# Patient Record
Sex: Male | Born: 1975
Health system: Southern US, Community
[De-identification: ages and names within clinical notes are randomized; demographics above are authoritative.]

## PROBLEM LIST (undated history)

## (undated) DIAGNOSIS — E669 Obesity, unspecified: Secondary | ICD-10-CM

## (undated) DIAGNOSIS — E349 Endocrine disorder, unspecified: Secondary | ICD-10-CM

## (undated) DIAGNOSIS — L723 Sebaceous cyst: Secondary | ICD-10-CM

## (undated) DIAGNOSIS — I10 Essential (primary) hypertension: Secondary | ICD-10-CM

## (undated) DIAGNOSIS — D649 Anemia, unspecified: Secondary | ICD-10-CM

## (undated) DIAGNOSIS — N529 Male erectile dysfunction, unspecified: Secondary | ICD-10-CM

## (undated) DIAGNOSIS — E230 Hypopituitarism: Secondary | ICD-10-CM

## (undated) DIAGNOSIS — E119 Type 2 diabetes mellitus without complications: Secondary | ICD-10-CM

## (undated) DIAGNOSIS — F32A Depression, unspecified: Secondary | ICD-10-CM

## (undated) DIAGNOSIS — E291 Testicular hypofunction: Secondary | ICD-10-CM

## (undated) DIAGNOSIS — L0390 Cellulitis, unspecified: Secondary | ICD-10-CM

## (undated) DIAGNOSIS — F329 Major depressive disorder, single episode, unspecified: Secondary | ICD-10-CM

## (undated) HISTORY — DX: Sebaceous cyst: L72.3

## (undated) HISTORY — DX: Obesity, unspecified: E66.9

## (undated) HISTORY — DX: Type 2 diabetes mellitus without complications: E11.9

## (undated) HISTORY — DX: Male erectile dysfunction, unspecified: N52.9

## (undated) HISTORY — DX: Testicular hypofunction: E29.1

## (undated) HISTORY — DX: Endocrine disorder, unspecified: E34.9

## (undated) HISTORY — DX: Hypopituitarism: E23.0

## (undated) HISTORY — DX: Anemia, unspecified: D64.9

## (undated) HISTORY — DX: Cellulitis, unspecified: L03.90

## (undated) HISTORY — DX: Essential (primary) hypertension: I10

## (undated) HISTORY — DX: Depression, unspecified: F32.A

## (undated) HISTORY — DX: Major depressive disorder, single episode, unspecified: F32.9

---

## 1999-05-07 ENCOUNTER — Encounter: Admission: RE | Admit: 1999-05-07 | Discharge: 1999-08-05 | Payer: Self-pay | Admitting: Internal Medicine

## 1999-09-07 ENCOUNTER — Encounter: Admission: RE | Admit: 1999-09-07 | Discharge: 1999-12-06 | Payer: Self-pay | Admitting: Internal Medicine

## 2004-09-25 ENCOUNTER — Ambulatory Visit: Payer: Self-pay | Admitting: Internal Medicine

## 2005-06-24 ENCOUNTER — Emergency Department (HOSPITAL_COMMUNITY): Admission: EM | Admit: 2005-06-24 | Discharge: 2005-06-24 | Payer: Self-pay | Admitting: Emergency Medicine

## 2005-07-14 ENCOUNTER — Ambulatory Visit: Payer: Self-pay | Admitting: Internal Medicine

## 2005-08-18 ENCOUNTER — Ambulatory Visit: Payer: Self-pay | Admitting: Internal Medicine

## 2005-08-25 ENCOUNTER — Ambulatory Visit: Payer: Self-pay

## 2005-08-25 ENCOUNTER — Encounter: Payer: Self-pay | Admitting: Cardiology

## 2007-04-03 ENCOUNTER — Ambulatory Visit: Payer: Self-pay | Admitting: Internal Medicine

## 2007-04-03 LAB — CONVERTED CEMR LAB
ALT: 21 U/L (ref 0–53)
AST: 18 U/L (ref 0–37)
Albumin: 3.6 g/dL (ref 3.5–5.2)
Alkaline Phosphatase: 55 U/L (ref 39–117)
BUN: 11 mg/dL (ref 6–23)
Basophils Absolute: 0 K/uL (ref 0.0–0.1)
Basophils Relative: 0.5 % (ref 0.0–1.0)
Bilirubin, Direct: 0.1 mg/dL (ref 0.0–0.3)
CO2: 31 meq/L (ref 19–32)
Calcium: 8.7 mg/dL (ref 8.4–10.5)
Chloride: 100 meq/L (ref 96–112)
Cholesterol: 189 mg/dL (ref 0–200)
Creatinine, Ser: 0.9 mg/dL (ref 0.4–1.5)
Eosinophils Absolute: 0.4 K/uL (ref 0.0–0.6)
Eosinophils Relative: 4.9 % (ref 0.0–5.0)
GFR calc Af Amer: 127 mL/min
GFR calc non Af Amer: 105 mL/min
Glucose, Bld: 94 mg/dL (ref 70–99)
HCT: 40.7 % (ref 39.0–52.0)
HDL: 32.7 mg/dL — ABNORMAL LOW (ref >39.0)
Hemoglobin: 14.2 g/dL (ref 13.0–17.0)
Ketones, urine, test strip: NEGATIVE
LDL Cholesterol: 140 mg/dL — ABNORMAL HIGH (ref 0–99)
Lymphocytes Relative: 22.8 % (ref 12.0–46.0)
MCHC: 34.8 g/dL (ref 30.0–36.0)
MCV: 83 fL (ref 78.0–100.0)
Monocytes Absolute: 0.5 K/uL (ref 0.2–0.7)
Monocytes Relative: 5.5 % (ref 3.0–11.0)
Neutro Abs: 5.7 K/uL (ref 1.4–7.7)
Neutrophils Relative %: 66.3 % (ref 43.0–77.0)
Nitrite: NEGATIVE
Platelets: 339 K/uL (ref 150–400)
Potassium: 4.2 meq/L (ref 3.5–5.1)
RBC: 4.91 M/uL (ref 4.22–5.81)
RDW: 13.7 % (ref 11.5–14.6)
Sodium: 139 meq/L (ref 135–145)
Specific Gravity, Urine: 1.025
TSH: 4.14 u[IU]/mL (ref 0.35–5.50)
Total Bilirubin: 0.7 mg/dL (ref 0.3–1.2)
Total CHOL/HDL Ratio: 5.8
Total Protein: 7 g/dL (ref 6.0–8.3)
Triglycerides: 82 mg/dL (ref 0–149)
Urobilinogen, UA: 2
VLDL: 16 mg/dL (ref 0–40)
WBC Urine, dipstick: NEGATIVE
WBC: 8.6 10*3/microliter (ref 4.5–10.5)
pH: 5.5

## 2007-04-10 ENCOUNTER — Ambulatory Visit: Payer: Self-pay | Admitting: Internal Medicine

## 2007-04-17 ENCOUNTER — Ambulatory Visit: Payer: Self-pay | Admitting: Internal Medicine

## 2007-04-17 DIAGNOSIS — L723 Sebaceous cyst: Secondary | ICD-10-CM | POA: Insufficient documentation

## 2007-04-17 LAB — CONVERTED CEMR LAB
FSH: 3.2 milliintl units/mL
LH: 3.1 milliintl units/mL
Prolactin: 9 ng/mL
Testosterone: 157.48 ng/dL — ABNORMAL LOW (ref 350.00–890)

## 2007-04-20 ENCOUNTER — Ambulatory Visit: Payer: Self-pay | Admitting: Internal Medicine

## 2007-04-20 DIAGNOSIS — E291 Testicular hypofunction: Secondary | ICD-10-CM | POA: Insufficient documentation

## 2007-05-09 ENCOUNTER — Telehealth (INDEPENDENT_AMBULATORY_CARE_PROVIDER_SITE_OTHER): Payer: Self-pay | Admitting: *Deleted

## 2007-05-31 ENCOUNTER — Ambulatory Visit: Payer: Self-pay | Admitting: Internal Medicine

## 2007-05-31 DIAGNOSIS — L0390 Cellulitis, unspecified: Secondary | ICD-10-CM

## 2007-05-31 DIAGNOSIS — L0291 Cutaneous abscess, unspecified: Secondary | ICD-10-CM | POA: Insufficient documentation

## 2007-06-01 ENCOUNTER — Telehealth: Payer: Self-pay | Admitting: Internal Medicine

## 2007-06-01 LAB — CONVERTED CEMR LAB
Basophils Absolute: 0 10*3/uL (ref 0.0–0.1)
Basophils Relative: 0.1 % (ref 0.0–1.0)
Eosinophils Absolute: 0.4 10*3/uL (ref 0.0–0.6)
Eosinophils Relative: 4.3 % (ref 0.0–5.0)
HCT: 41.4 % (ref 39.0–52.0)
Hemoglobin: 14 g/dL (ref 13.0–17.0)
Lymphocytes Relative: 20.1 % (ref 12.0–46.0)
MCHC: 33.8 g/dL (ref 30.0–36.0)
MCV: 85 fL (ref 78.0–100.0)
Monocytes Absolute: 0.6 10*3/uL (ref 0.2–0.7)
Monocytes Relative: 6.5 % (ref 3.0–11.0)
Neutro Abs: 6.8 10*3/uL (ref 1.4–7.7)
Neutrophils Relative %: 69 % (ref 43.0–77.0)
Platelets: 320 10*3/uL (ref 150–400)
RBC: 4.87 M/uL (ref 4.22–5.81)
RDW: 14.1 % (ref 11.5–14.6)
WBC: 9.7 10*3/uL (ref 4.5–10.5)

## 2007-06-02 ENCOUNTER — Encounter: Payer: Self-pay | Admitting: Internal Medicine

## 2007-06-02 ENCOUNTER — Ambulatory Visit: Payer: Self-pay

## 2007-06-06 ENCOUNTER — Telehealth: Payer: Self-pay | Admitting: Internal Medicine

## 2007-06-27 ENCOUNTER — Ambulatory Visit: Payer: Self-pay | Admitting: Endocrinology

## 2007-09-19 ENCOUNTER — Encounter: Payer: Self-pay | Admitting: Endocrinology

## 2008-08-08 ENCOUNTER — Telehealth: Payer: Self-pay | Admitting: Internal Medicine

## 2008-08-09 ENCOUNTER — Ambulatory Visit: Payer: Self-pay | Admitting: Endocrinology

## 2008-08-09 LAB — CONVERTED CEMR LAB
Iron: 103 ug/dL
Saturation Ratios: 27.2 %
Testosterone: 178.98 ng/dL — ABNORMAL LOW
Transferrin: 270.2 mg/dL

## 2008-08-13 ENCOUNTER — Telehealth (INDEPENDENT_AMBULATORY_CARE_PROVIDER_SITE_OTHER): Payer: Self-pay | Admitting: *Deleted

## 2009-03-03 ENCOUNTER — Ambulatory Visit: Payer: Self-pay | Admitting: Family Medicine

## 2009-03-03 DIAGNOSIS — H60399 Other infective otitis externa, unspecified ear: Secondary | ICD-10-CM | POA: Insufficient documentation

## 2009-03-05 ENCOUNTER — Ambulatory Visit: Payer: Self-pay | Admitting: Family Medicine

## 2009-03-05 DIAGNOSIS — H66009 Acute suppurative otitis media without spontaneous rupture of ear drum, unspecified ear: Secondary | ICD-10-CM | POA: Insufficient documentation

## 2009-08-22 ENCOUNTER — Telehealth: Payer: Self-pay | Admitting: Internal Medicine

## 2009-11-26 ENCOUNTER — Ambulatory Visit: Payer: Self-pay | Admitting: Internal Medicine

## 2009-11-26 DIAGNOSIS — J020 Streptococcal pharyngitis: Secondary | ICD-10-CM | POA: Insufficient documentation

## 2009-11-26 LAB — CONVERTED CEMR LAB
Cholesterol, target level: 200 mg/dL
HDL goal, serum: 40 mg/dL
LDL Goal: 160 mg/dL
Rapid Strep: NEGATIVE

## 2010-07-21 NOTE — Progress Notes (Signed)
Summary: med refill  Phone Note Refill Request Message from:  Patient on August 22, 2009 4:23 PM  Refills Requested: Medication #1:  CLOMIPHENE CITRATE 50 MG TABS 1/4 tab three times weekly Initial call taken by: Lucious Groves,  August 22, 2009 4:23 PM  Follow-up for Phone Call        please refill x 1 f/u ov is due Follow-up by: Minus Breeding MD,  August 22, 2009 4:35 PM  Additional Follow-up for Phone Call Additional follow up Details #1::        refilled. Left message on voicemail to call back to office  Additional Follow-up by: Sydell Axon,  August 25, 2009 8:33 AM    Additional Follow-up for Phone Call Additional follow up Details #2::    patient notified Follow-up by: Sydell Axon,  August 25, 2009 2:48 PM  Prescriptions: CLOMIPHENE CITRATE 50 MG TABS (CLOMIPHENE CITRATE) 1/4 tab three times weekly  #5 x 1   Entered by:   Lucious Groves   Authorized by:   Minus Breeding MD   Signed by:   Lucious Groves on 08/25/2009   Method used:   Electronically to        [facility]. [phone]* (retail)       [phone]       [place], [place]  [address]       Ph: [phone]       Fax: [phone]   RxID:   [id number]

## 2010-07-21 NOTE — Assessment & Plan Note (Signed)
Summary: ? strep//ccm   Vital Signs:  Patient profile:   35 year old male Temp:     99 degrees F oral Pulse rate:   80 / minute Resp:     14 per minute BP sitting:   130 / 90  (left arm) Cuff size:   large CC: sorethroat since yesterday with fever , Lipid Management   CC:  sorethroat since yesterday with fever  and Lipid Management.  History of Present Illness: fever and sore throat with strept negative severe sore throat chills last night hx of tonsilitis no DM weight loss on atkins no hx of mono?   Lipid Management History:      Positive NCEP/ATP III risk factors include HDL cholesterol less than 40.  Negative NCEP/ATP III risk factors include male age less than 18 years old and non-tobacco-user status.    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 1/4 Tab Three Times Weekly 2)  Ofloxacin 0.3 % Soln (Ofloxacin) .Marland Kitchen.. 10 Drops To Affected Ear Daily For 7 Days. 3)  Azithromycin 250 Mg Tabs (Azithromycin) .... 2 By Mouth Today Then 1 By Mouth Daily For 4 Days.  Current Medications (verified): 1)  Clomiphene Citrate 50 Mg Tabs (Clomiphene  Citrate) .... 1/4 Tab Three Times Weekly 2)  Keflex 500 Mg Caps (Cephalexin) .... One By Mouth Qid For 10 Days  Allergies (verified): 1)  ! Amoxicillin (Amoxicillin)  Past History:  Family History: Last updated: 07-16-07 father deceased sudden cardiac no testosterone problems or infertility  Social History: Last updated: 07/16/07 Never Smoked single student  Risk Factors: Smoking Status: never (11/26/2009)  Past medical, surgical, family and social histories (including risk factors) reviewed, and no changes noted (except as noted below).  Past Medical History: Reviewed history from 08/09/2008 and no changes required. 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 father deceased sudden cardiac no testosterone problems or infertility  Social History: Reviewed history from Jul 16, 2007 and no changes required. Never Smoked single student  Review of Systems  The patient denies anorexia, fever, weight loss, weight gain, vision loss, decreased hearing, hoarseness, chest pain, syncope, dyspnea on exertion, peripheral edema, prolonged cough, headaches, hemoptysis, abdominal pain, melena, hematochezia, severe indigestion/heartburn, hematuria, incontinence, genital sores, muscle weakness, suspicious skin lesions, transient blindness, difficulty walking, depression, unusual weight change, abnormal bleeding, enlarged lymph nodes, angioedema, and breast masses.    Physical Exam  General:  alert and underweight appearing.   Head:  normocephalic and atraumatic.   Eyes:  pupils equal and pupils round.   Ears:  R ear normal and L ear normal.  Nose:  no nasal discharge.   Mouth:  pharyngeal erythema, tonsil hypertropied, white plaque(s), pharyngeal  exudate, and posterior lymphoid hypertrophy.   very large tonsils Lungs:  normal respiratory effort and no wheezes.   Heart:  normal rate and regular rhythm.   Cervical Nodes:  shoddy posterior nodes Psych:  Oriented X3 and not anxious appearing.     Impression & Recommendations:  Problem # 1:  STREPTOCOCCAL PHARYNGITIS (ICD-034.0) Assessment New significant ST with increased tonsil size and white discharge The following medications were removed from the medication list:    Azithromycin 250 Mg Tabs (Azithromycin) .Marland Kitchen... 2 by mouth today then 1 by mouth daily for 4 days. His updated medication list for this problem includes:    Keflex 500 Mg Caps (Cephalexin) ..... One by mouth qid for 10 days  Instructed to complete antibiotics and call if not improved in 48 hours.   consider mono titiers??  Complete Medication List: 1)  Clomiphene Citrate 50 Mg Tabs (Clomiphene citrate) .... 1/4 tab three times weekly 2)  Keflex 500 Mg Caps (Cephalexin) .... One by mouth qid for 10 days  Other Orders: Rapid Strep ([address])  Lipid Assessment/Plan:      Based on NCEP/ATP III, the patient's risk factor category is "0-1 risk factors".  The patient's lipid goals are as follows: Total cholesterol goal is 200; LDL cholesterol goal is 160; HDL cholesterol goal is 40; Triglyceride goal is 150.     Patient Instructions: 1)  Take your antibiotic as prescribed until ALL of it is gone, but stop if you develop a rash or swelling and contact our office as soon as possible. 2)  if the tonsils do not respond call back to [facility] a mono test 3)  ( no hx of mono) Prescriptions: KEFLEX 500 MG CAPS (CEPHALEXIN) one by mouth QID for 10 days  #40 x 0   Entered and Authorized by:   Stacie Glaze MD   Signed by:   Stacie Glaze MD on 11/26/2009   Method used:   Electronically to        [facility]. [phone]* (retail)       [phone] [address].       [place], [place]  [address]        Ph: [phone]       Fax: [phone]   RxID:   [id number]   Laboratory Results    Other Tests  Rapid Strep: negative Comments: Rita Ohara  November 26, 2009 5:17 PM   Kit Test Internal QC: Negative   (Normal Range: Negative)

## 2010-08-10 ENCOUNTER — Ambulatory Visit (INDEPENDENT_AMBULATORY_CARE_PROVIDER_SITE_OTHER): Payer: BC Managed Care – PPO | Admitting: Internal Medicine

## 2010-08-10 ENCOUNTER — Encounter: Payer: Self-pay | Admitting: Internal Medicine

## 2010-08-10 DIAGNOSIS — IMO0001 Reserved for inherently not codable concepts without codable children: Secondary | ICD-10-CM

## 2010-08-10 DIAGNOSIS — M7918 Myalgia, other site: Secondary | ICD-10-CM | POA: Insufficient documentation

## 2010-08-10 MED ORDER — DICLOFENAC SODIUM 75 MG PO TBEC: DELAYED_RELEASE_TABLET | ORAL | Status: AC

## 2010-08-10 MED ORDER — HYDROCODONE-ACETAMINOPHEN 5-500 MG PO TABS: 1.0000 | ORAL_TABLET | ORAL | Status: DC | PRN

## 2010-08-10 NOTE — Assessment & Plan Note (Signed)
Appears to be MSK etiology s/p fall. Discussed possible xray for rib fx but would not change tx. Begin nsaid x3 days then prn with food and no other nsaid. Lortab prn sparingly. Followup if no improvement or worsening.

## 2010-08-10 NOTE — Progress Notes (Signed)
  Subjective:    Patient ID: Scott Franklin, male    DOB: [birth year], 35 y.o.   MRN: [id number]  HPI Pt presents to clinic for evaluation of left anterior rib pain. States suffered fall ~5 days ago slipping on a rug at the bottom of the stairs. Struck left ant lower chest wall. No syncope or other injury and denies dyspnea. Pain became more severe a few days later and now occurs with position change, certain movements and deep breathing. Attempted asa for the pain without significant improvement. No other alleviating or exacerbating factors.   Reviewed PMH, medications, and allergies.    Review of Systems     Objective:   Physical Exam  Constitutional: He appears well-developed and well-nourished.  HENT:  Head: Normocephalic and atraumatic.  Right Ear: External ear normal.  Left Ear: External ear normal.  Eyes: Conjunctivae are normal. No scleral icterus.  Pulmonary/Chest: Effort normal and breath sounds normal. No respiratory distress. He has no wheezes. He has no rales.  Musculoskeletal:       Tenderness to palpation left lower anterior ribs without bony abnormality. No overlying echymomsis or skin abn.  Skin: Skin is warm and dry. He is not diaphoretic.          Assessment & Plan:

## 2010-08-11 ENCOUNTER — Telehealth: Payer: Self-pay | Admitting: *Deleted

## 2010-08-11 NOTE — Telephone Encounter (Signed)
Needs a note to be out of work for at least one week, and will bring his papers to be filled out.

## 2010-08-12 DIAGNOSIS — Z0279 Encounter for issue of other medical certificate: Secondary | ICD-10-CM

## 2010-08-13 NOTE — Telephone Encounter (Signed)
Paperwork to be filled out for weeks of 08/10/2010-08/21/2010. Pt is aware

## 2010-08-14 ENCOUNTER — Telehealth: Payer: Self-pay | Admitting: Internal Medicine

## 2010-08-14 NOTE — Telephone Encounter (Signed)
Pt called to ck on status of disability paperwork that he brought in this past week...the patient can be reached at [phone].

## 2010-08-14 NOTE — Telephone Encounter (Signed)
Paper work is ready and up front  

## 2010-08-17 ENCOUNTER — Telehealth: Payer: Self-pay | Admitting: Internal Medicine

## 2010-08-17 NOTE — Telephone Encounter (Signed)
Completed and faxed last week

## 2010-08-17 NOTE — Telephone Encounter (Signed)
Pt is call checking on status of short term disability form  [employer].

## 2010-08-27 ENCOUNTER — Telehealth: Payer: Self-pay | Admitting: Internal Medicine

## 2010-08-27 NOTE — Telephone Encounter (Signed)
i'll extend. What do i need to do? Another form?

## 2010-08-27 NOTE — Telephone Encounter (Signed)
Pt would like tameisha to return his call concerning disability form

## 2010-08-27 NOTE — Telephone Encounter (Signed)
Would like to know if Dr. Rodena Medin would be willing to extend his short term disability from 2 weeks to 3 because he has been out of work this week as well, which makes 3 weeks total. Pt notes that he is healing but still has some pain and discomfort. He discontinued the pain meds and will return to work on Monday. Pt says he will come back in the offc if Dr. Rodena Medin requires another ov to access his recovery.

## 2010-08-27 NOTE — Telephone Encounter (Signed)
Pt aware and process started

## 2010-08-28 ENCOUNTER — Telehealth: Payer: Self-pay | Admitting: Internal Medicine

## 2010-08-28 NOTE — Telephone Encounter (Signed)
Done. Pt calling to see if paperwork has been faxed over from [facility] yet

## 2010-08-28 NOTE — Telephone Encounter (Signed)
Pt called to speak with nurse ref disability ext papers.... Pt can be reached at [phone].

## 2010-11-12 ENCOUNTER — Encounter: Payer: Self-pay | Admitting: Internal Medicine

## 2010-11-12 ENCOUNTER — Ambulatory Visit (INDEPENDENT_AMBULATORY_CARE_PROVIDER_SITE_OTHER): Payer: BC Managed Care – PPO | Admitting: Internal Medicine

## 2010-11-12 DIAGNOSIS — J069 Acute upper respiratory infection, unspecified: Secondary | ICD-10-CM

## 2010-11-12 MED ORDER — LEVOFLOXACIN 500 MG PO TABS: 500.0000 mg | ORAL_TABLET | Freq: Every day | ORAL | Status: AC

## 2010-11-16 ENCOUNTER — Inpatient Hospital Stay (HOSPITAL_COMMUNITY)
Admission: EM | Admit: 2010-11-16 | Discharge: 2010-11-23 | DRG: 584 | Disposition: A | Discharge status: Home Health | Payer: BC Managed Care – PPO | Attending: Internal Medicine | Admitting: Internal Medicine

## 2010-11-16 ENCOUNTER — Emergency Department (HOSPITAL_COMMUNITY): Payer: BC Managed Care – PPO

## 2010-11-16 DIAGNOSIS — I498 Other specified cardiac arrhythmias: Secondary | ICD-10-CM | POA: Diagnosis present

## 2010-11-16 DIAGNOSIS — R5381 Other malaise: Secondary | ICD-10-CM | POA: Diagnosis present

## 2010-11-16 DIAGNOSIS — I959 Hypotension, unspecified: Secondary | ICD-10-CM | POA: Diagnosis present

## 2010-11-16 DIAGNOSIS — J13 Pneumonia due to Streptococcus pneumoniae: Secondary | ICD-10-CM

## 2010-11-16 DIAGNOSIS — Z6841 Body Mass Index (BMI) 40.0 and over, adult: Secondary | ICD-10-CM

## 2010-11-16 DIAGNOSIS — J9600 Acute respiratory failure, unspecified whether with hypoxia or hypercapnia: Secondary | ICD-10-CM

## 2010-11-16 DIAGNOSIS — A419 Sepsis, unspecified organism: Principal | ICD-10-CM | POA: Diagnosis present

## 2010-11-16 DIAGNOSIS — G4733 Obstructive sleep apnea (adult) (pediatric): Secondary | ICD-10-CM | POA: Diagnosis present

## 2010-11-16 DIAGNOSIS — D72829 Elevated white blood cell count, unspecified: Secondary | ICD-10-CM | POA: Diagnosis present

## 2010-11-16 DIAGNOSIS — E662 Morbid (severe) obesity with alveolar hypoventilation: Secondary | ICD-10-CM | POA: Diagnosis present

## 2010-11-16 DIAGNOSIS — J189 Pneumonia, unspecified organism: Secondary | ICD-10-CM | POA: Diagnosis present

## 2010-11-16 DIAGNOSIS — E871 Hypo-osmolality and hyponatremia: Secondary | ICD-10-CM | POA: Diagnosis present

## 2010-11-16 DIAGNOSIS — J9620 Acute and chronic respiratory failure, unspecified whether with hypoxia or hypercapnia: Secondary | ICD-10-CM | POA: Diagnosis present

## 2010-11-16 LAB — URINE MICROSCOPIC-ADD ON

## 2010-11-16 LAB — GLUCOSE, CAPILLARY
Glucose-Capillary: 135 mg/dL — ABNORMAL HIGH (ref 70–99)
Glucose-Capillary: 119 mg/dL — ABNORMAL HIGH (ref 70–99)

## 2010-11-16 LAB — COMPREHENSIVE METABOLIC PANEL
ALT: 21 U/L (ref 0–53)
AST: 27 U/L (ref 0–37)
Albumin: 2.6 g/dL — ABNORMAL LOW (ref 3.5–5.2)
Alkaline Phosphatase: 53 U/L (ref 39–117)
BUN: 16 mg/dL (ref 6–23)
CO2: 29 mEq/L (ref 19–32)
Calcium: 7.9 mg/dL — ABNORMAL LOW (ref 8.4–10.5)
Chloride: 89 mEq/L — ABNORMAL LOW (ref 96–112)
Creatinine, Ser: 1.11 mg/dL (ref 0.4–1.5)
GFR calc Af Amer: >60 mL/min (ref >60)
GFR calc non Af Amer: >60 mL/min (ref >60)
Glucose, Bld: 122 mg/dL — ABNORMAL HIGH (ref 70–99)
Potassium: 4.2 mEq/L (ref 3.5–5.1)
Sodium: 128 mEq/L — ABNORMAL LOW (ref 135–145)
Total Bilirubin: 0.3 mg/dL (ref 0.3–1.2)
Total Protein: 7 g/dL (ref 6.0–8.3)

## 2010-11-16 LAB — LACTIC ACID, PLASMA: Lactic Acid, Venous: 1.7 mmol/L (ref 0.5–2.2)

## 2010-11-16 LAB — URINALYSIS, ROUTINE W REFLEX MICROSCOPIC
Glucose, UA: 100 mg/dL — AB
Ketones, ur: 15 mg/dL — AB
Leukocytes, UA: NEGATIVE
Nitrite: NEGATIVE
Protein, ur: 100 mg/dL — AB
Specific Gravity, Urine: 1.03 (ref 1.005–1.030)
Urobilinogen, UA: 1 mg/dL (ref 0.0–1.0)
pH: 5.5 (ref 5.0–8.0)

## 2010-11-16 LAB — CARDIAC PANEL(CRET KIN+CKTOT+MB+TROPI)
CK, MB: 1.8 ng/mL (ref 0.3–4.0)
Relative Index: 1.2 (ref 0.0–2.5)
Total CK: 149 U/L (ref 7–232)
Troponin I: <0.3 ng/mL (ref <0.30)

## 2010-11-16 LAB — BLOOD GAS, ARTERIAL
Acid-Base Excess: 6.7 mmol/L — ABNORMAL HIGH (ref 0.0–2.0)
Bicarbonate: 32.7 mEq/L — ABNORMAL HIGH (ref 20.0–24.0)
FIO2: 100 %
O2 Saturation: 95.8 %
Patient temperature: 98.6
TCO2: 34.7 mmol/L (ref 0–100)
pCO2 arterial: 65.6 mmHg (ref 35.0–45.0)
pH, Arterial: 7.318 — ABNORMAL LOW (ref 7.350–7.450)
pO2, Arterial: 84.8 mmHg (ref 80.0–100.0)

## 2010-11-16 LAB — DIFFERENTIAL
Basophils Absolute: 0.2 10*3/uL — ABNORMAL HIGH (ref 0.0–0.1)
Basophils Relative: 1 % (ref 0–1)
Eosinophils Absolute: 0 10*3/uL (ref 0.0–0.7)
Eosinophils Relative: 0 % (ref 0–5)
Lymphocytes Relative: 8 % — ABNORMAL LOW (ref 12–46)
Lymphs Abs: 1.4 10*3/uL (ref 0.7–4.0)
Monocytes Absolute: 1 10*3/uL (ref 0.1–1.0)
Monocytes Relative: 6 % (ref 3–12)
Neutro Abs: 14.5 10*3/uL — ABNORMAL HIGH (ref 1.7–7.7)
Neutrophils Relative %: 85 % — ABNORMAL HIGH (ref 43–77)
WBC Morphology: INCREASED

## 2010-11-16 LAB — CBC
HCT: 43.1 % (ref 39.0–52.0)
Hemoglobin: 15.2 g/dL (ref 13.0–17.0)
MCH: 29 pg (ref 26.0–34.0)
MCHC: 35.3 g/dL (ref 30.0–36.0)
MCV: 82.1 fL (ref 78.0–100.0)
Platelets: 238 10*3/uL (ref 150–400)
RBC: 5.25 MIL/uL (ref 4.22–5.81)
RDW: 14.1 % (ref 11.5–15.5)
WBC: 17.1 10*3/uL — ABNORMAL HIGH (ref 4.0–10.5)

## 2010-11-16 LAB — MRSA PCR SCREENING: MRSA by PCR: NEGATIVE

## 2010-11-16 LAB — CK TOTAL AND CKMB (NOT AT ARMC)
CK, MB: 1.4 ng/mL (ref 0.3–4.0)
Relative Index: 0.9 (ref 0.0–2.5)
Total CK: 149 U/L (ref 7–232)

## 2010-11-16 LAB — APTT: aPTT: 41 seconds — ABNORMAL HIGH (ref 24–37)

## 2010-11-16 LAB — POCT I-STAT 3, ART BLOOD GAS (G3+)
Acid-Base Excess: 5 mmol/L — ABNORMAL HIGH (ref 0.0–2.0)
Bicarbonate: 30.7 mEq/L — ABNORMAL HIGH (ref 20.0–24.0)
O2 Saturation: 97 %
TCO2: 32 mmol/L (ref 0–100)
pCO2 arterial: 49.5 mmHg — ABNORMAL HIGH (ref 35.0–45.0)
pH, Arterial: 7.402 (ref 7.350–7.450)
pO2, Arterial: 92 mmHg (ref 80.0–100.0)

## 2010-11-16 LAB — LIPASE, BLOOD
Lipase: 62 U/L — ABNORMAL HIGH (ref 11–59)
Lipase: 23 U/L (ref 11–59)

## 2010-11-16 LAB — PROTIME-INR
INR: 1.28 (ref 0.00–1.49)
Prothrombin Time: 16.2 seconds — ABNORMAL HIGH (ref 11.6–15.2)

## 2010-11-16 LAB — MAGNESIUM: Magnesium: 2 mg/dL (ref 1.5–2.5)

## 2010-11-16 LAB — TROPONIN I: Troponin I: <0.3 ng/mL (ref <0.30)

## 2010-11-16 LAB — STREP PNEUMONIAE URINARY ANTIGEN: Strep Pneumo Urinary Antigen: NEGATIVE

## 2010-11-16 LAB — PRO B NATRIURETIC PEPTIDE: Pro B Natriuretic peptide (BNP): 244.1 pg/mL — ABNORMAL HIGH (ref 0–125)

## 2010-11-16 LAB — OSMOLALITY: Osmolality: 271 mOsm/kg — ABNORMAL LOW (ref 275–300)

## 2010-11-16 LAB — OSMOLALITY, URINE: Osmolality, Ur: 345 mOsm/kg — ABNORMAL LOW (ref 390–1090)

## 2010-11-16 LAB — PROCALCITONIN: Procalcitonin: 0.86 ng/mL

## 2010-11-17 ENCOUNTER — Inpatient Hospital Stay (HOSPITAL_COMMUNITY): Payer: BC Managed Care – PPO

## 2010-11-17 DIAGNOSIS — J13 Pneumonia due to Streptococcus pneumoniae: Secondary | ICD-10-CM

## 2010-11-17 DIAGNOSIS — J9600 Acute respiratory failure, unspecified whether with hypoxia or hypercapnia: Secondary | ICD-10-CM

## 2010-11-17 DIAGNOSIS — I517 Cardiomegaly: Secondary | ICD-10-CM

## 2010-11-17 LAB — BASIC METABOLIC PANEL
BUN: 20 mg/dL (ref 6–23)
CO2: 33 mEq/L — ABNORMAL HIGH (ref 19–32)
Calcium: 7.9 mg/dL — ABNORMAL LOW (ref 8.4–10.5)
Chloride: 91 mEq/L — ABNORMAL LOW (ref 96–112)
Creatinine, Ser: 1.11 mg/dL (ref 0.4–1.5)
GFR calc Af Amer: >60 mL/min (ref >60)
GFR calc non Af Amer: >60 mL/min (ref >60)
Glucose, Bld: 90 mg/dL (ref 70–99)
Potassium: 3.8 mEq/L (ref 3.5–5.1)
Sodium: 134 mEq/L — ABNORMAL LOW (ref 135–145)

## 2010-11-17 LAB — DIFFERENTIAL
Basophils Absolute: 0.1 10*3/uL (ref 0.0–0.1)
Basophils Relative: 1 % (ref 0–1)
Eosinophils Absolute: 0 10*3/uL (ref 0.0–0.7)
Eosinophils Relative: 0 % (ref 0–5)
Lymphocytes Relative: 16 % (ref 12–46)
Lymphs Abs: 1.7 10*3/uL (ref 0.7–4.0)
Monocytes Absolute: 1.1 10*3/uL — ABNORMAL HIGH (ref 0.1–1.0)
Monocytes Relative: 10 % (ref 3–12)
Neutro Abs: 7.8 10*3/uL — ABNORMAL HIGH (ref 1.7–7.7)
Neutrophils Relative %: 73 % (ref 43–77)
WBC Morphology: INCREASED

## 2010-11-17 LAB — BLOOD GAS, ARTERIAL
Acid-Base Excess: 6.5 mmol/L — ABNORMAL HIGH (ref 0.0–2.0)
Bicarbonate: 31.9 mEq/L — ABNORMAL HIGH (ref 20.0–24.0)
Delivery systems: POSITIVE
Drawn by: 20517
Expiratory PAP: 12
FIO2: 0.7 %
Inspiratory PAP: 20
O2 Saturation: 92.9 %
Patient temperature: 98.1
TCO2: 33.7 mmol/L (ref 0–100)
pCO2 arterial: 58.2 mmHg (ref 35.0–45.0)
pH, Arterial: 7.356 (ref 7.350–7.450)
pO2, Arterial: 65.5 mmHg — ABNORMAL LOW (ref 80.0–100.0)

## 2010-11-17 LAB — CARDIAC PANEL(CRET KIN+CKTOT+MB+TROPI)
CK, MB: 1.7 ng/mL (ref 0.3–4.0)
Relative Index: 1.4 (ref 0.0–2.5)
Total CK: 118 U/L (ref 7–232)
Troponin I: <0.3 ng/mL (ref <0.30)

## 2010-11-17 LAB — LEGIONELLA ANTIGEN, URINE: Legionella Antigen, Urine: NEGATIVE

## 2010-11-17 LAB — CBC
HCT: 41.6 % (ref 39.0–52.0)
Hemoglobin: 14.3 g/dL (ref 13.0–17.0)
MCH: 28.5 pg (ref 26.0–34.0)
MCHC: 34.4 g/dL (ref 30.0–36.0)
MCV: 83 fL (ref 78.0–100.0)
Platelets: 204 10*3/uL (ref 150–400)
RBC: 5.01 MIL/uL (ref 4.22–5.81)
RDW: 14.6 % (ref 11.5–15.5)
WBC: 10.7 10*3/uL — ABNORMAL HIGH (ref 4.0–10.5)

## 2010-11-17 LAB — POCT I-STAT 3, ART BLOOD GAS (G3+)
Acid-Base Excess: 7 mmol/L — ABNORMAL HIGH (ref 0.0–2.0)
Bicarbonate: 34.3 mEq/L — ABNORMAL HIGH (ref 20.0–24.0)
O2 Saturation: 92 %
Patient temperature: 98.6
TCO2: 36 mmol/L (ref 0–100)
pCO2 arterial: 59.2 mmHg (ref 35.0–45.0)
pH, Arterial: 7.371 (ref 7.350–7.450)
pO2, Arterial: 66 mmHg — ABNORMAL LOW (ref 80.0–100.0)

## 2010-11-17 LAB — GLUCOSE, CAPILLARY
Glucose-Capillary: 104 mg/dL — ABNORMAL HIGH (ref 70–99)
Glucose-Capillary: 83 mg/dL (ref 70–99)
Glucose-Capillary: 95 mg/dL (ref 70–99)
Glucose-Capillary: 97 mg/dL (ref 70–99)

## 2010-11-17 LAB — URINE CULTURE
Colony Count: NO GROWTH
Culture  Setup Time: 201205281241
Culture: NO GROWTH

## 2010-11-17 NOTE — Consult Note (Signed)
Scott Franklin, Scott Franklin             ACCOUNT NO.:  [id number]  MEDICAL RECORD NO.:  [id number]           PATIENT TYPE:  I  LOCATION:  2109                         FACILITY:  [facility]  PHYSICIAN:  Charlcie Cradle. Delford Field, MD, FCCPDATE OF BIRTH:  [birth year]75  DATE OF CONSULTATION:  11/16/2010 DATE OF DISCHARGE:                                CONSULTATION   CHIEF COMPLAINT:  Respiratory distress, cough, pneumonia.  HISTORY OF PRESENT ILLNESS:  This is a 35 year old morbidly obese male admitted for pneumonia community acquired, acute hypoxic respiratory failure by [facility].  We were asked to see this patient.  The patient has had cough, fever, and dyspnea, was seen by primary care earlier in the week, treated with Levaquin orally, but has not improved. He comes to the hospital with progression in his symptom status.  PAST MEDICAL HISTORY:  Medical history of obesity only, otherwise noncontributory.  MEDICATIONS PRIOR TO ADMISSION:  None.  ALLERGIES:  PENICILLIN.  SOCIAL HISTORY:  He lives at home with his mother, works as a Copy with a desk job.  Does not smoke.  Does not drink alcohol.  FAMILY HISTORY:  Mother is alive and well.  Father died at 59 years of age of arrhythmia.  REVIEW OF SYSTEMS:  GENERAL:  The patient has had fever, chills, or sweats.  HEENT:  No headaches.  No nasal discharge.  No bleeding.  No voice change, vertigo, photophobia, visual or hearing loss.  SKIN:  No rash or lesions.  CARDIOVASCULAR:  No shortness of breath with rest and exertion.  Denies PND.  Denies chest pain.  Does have cough.  Denies wheezing, edema, palpitations, orthopnea, or syncope.  GU:  No frequency, urgency, dysuria, or hematuria.  GI:  No nausea, vomiting, diarrhea, melena, or change in bowel or bladder patterns.  ENDOCRINE: No polyuria, polydipsia, heat or cold intolerance.  No skin or hair change.  Other systems are reviewed and are negative.  PHYSICAL EXAMINATION:   VITAL SIGNS:  Temp is 101.8, blood pressure is 99/72, pulse 120, saturation 92% on 15 liters non-rebreather, and respirations 28. NEUROLOGIC:  This patient is awake and alert, moves all fours, is morbidly obese.  Cranial nerves II-XII grossly intact.  Strength 5/5 all in extremities.  Speech is clear. NECK:  Supple without lymphadenopathy.  No thyromegaly.  No bruits or jugular venous distention. CARDIOVASCULAR:  Normal S1 and S2.  No S3 or S4.  Regular rate and rhythm.  No murmur, rub, or gallop.  Normal PMI.  Pulses are 2+ and equal. GI:  Abdomen is soft, nontender.  Bowel sounds active.  No organomegaly. The patient is obese. GU:  Deferred. MUSCULOSKELETAL:  MAE.  No joint deformities. SKIN:  No rash or lesions.  LABORATORY DATA:  Chest x-ray showed left upper lobe airspace disease and cardiomegaly, mild interstitial edema.  Sodium 128, potassium 42, chloride 89, CO2 of 29, BUN 16, creatinine 1.1, blood sugar 122. Hemoglobin 15, white count 17,000, albumin 2.6.  Liver functions normal. BNP 244.  Procalcitonin 0.86.  IMPRESSION:  Left upper lobe community-acquired pneumonia with acute hypoxic respiratory failure and morbid obesity.  RECOMMENDATIONS:  Place  him to the ICU, agree with vancomycin and imipenem, add Zithromax, see orders,  titrate oxygen, give neb treatments, flutter valve and incentive spirometry.     Charlcie Cradle Delford Field, MD, [facility]     PEW/MEDQ  D:  11/16/2010  T:  11/16/2010  Job:  [id number]  Electronically Signed by Shan Levans MD FCCP on 11/17/2010 08:59:15 PM

## 2010-11-18 LAB — POCT I-STAT 3, ART BLOOD GAS (G3+)
Acid-Base Excess: 6 mmol/L — ABNORMAL HIGH (ref 0.0–2.0)
Bicarbonate: 32.2 mEq/L — ABNORMAL HIGH (ref 20.0–24.0)
O2 Saturation: 90 %
Patient temperature: 98.6
TCO2: 34 mmol/L (ref 0–100)
pCO2 arterial: 51.3 mmHg — ABNORMAL HIGH (ref 35.0–45.0)
pH, Arterial: 7.406 (ref 7.350–7.450)
pO2, Arterial: 61 mmHg — ABNORMAL LOW (ref 80.0–100.0)

## 2010-11-18 LAB — BASIC METABOLIC PANEL
BUN: 19 mg/dL (ref 6–23)
CO2: 33 mEq/L — ABNORMAL HIGH (ref 19–32)
Calcium: 8.1 mg/dL — ABNORMAL LOW (ref 8.4–10.5)
Chloride: 95 mEq/L — ABNORMAL LOW (ref 96–112)
Creatinine, Ser: 0.8 mg/dL (ref 0.4–1.5)
GFR calc Af Amer: >60 mL/min (ref >60)
GFR calc non Af Amer: >60 mL/min (ref >60)
Glucose, Bld: 75 mg/dL (ref 70–99)
Potassium: 3.9 mEq/L (ref 3.5–5.1)
Sodium: 135 mEq/L (ref 135–145)

## 2010-11-18 LAB — GLUCOSE, CAPILLARY: Glucose-Capillary: 79 mg/dL (ref 70–99)

## 2010-11-18 LAB — CBC
HCT: 41.1 % (ref 39.0–52.0)
Hemoglobin: 13.5 g/dL (ref 13.0–17.0)
MCH: 28.2 pg (ref 26.0–34.0)
MCHC: 32.8 g/dL (ref 30.0–36.0)
MCV: 86 fL (ref 78.0–100.0)
Platelets: 209 10*3/uL (ref 150–400)
RBC: 4.78 MIL/uL (ref 4.22–5.81)
RDW: 14.8 % (ref 11.5–15.5)
WBC: 6.4 10*3/uL (ref 4.0–10.5)

## 2010-11-19 ENCOUNTER — Inpatient Hospital Stay (HOSPITAL_COMMUNITY): Payer: BC Managed Care – PPO

## 2010-11-19 LAB — BASIC METABOLIC PANEL
BUN: 13 mg/dL (ref 6–23)
CO2: 32 mEq/L (ref 19–32)
Calcium: 8 mg/dL — ABNORMAL LOW (ref 8.4–10.5)
Chloride: 98 mEq/L (ref 96–112)
Creatinine, Ser: 0.72 mg/dL (ref 0.4–1.5)
GFR calc Af Amer: >60 mL/min (ref >60)
GFR calc non Af Amer: >60 mL/min (ref >60)
Glucose, Bld: 79 mg/dL (ref 70–99)
Potassium: 3.8 mEq/L (ref 3.5–5.1)
Sodium: 139 mEq/L (ref 135–145)

## 2010-11-19 LAB — CBC
HCT: 39.6 % (ref 39.0–52.0)
Hemoglobin: 12.8 g/dL — ABNORMAL LOW (ref 13.0–17.0)
MCH: 28 pg (ref 26.0–34.0)
MCHC: 32.3 g/dL (ref 30.0–36.0)
MCV: 86.7 fL (ref 78.0–100.0)
Platelets: 230 10*3/uL (ref 150–400)
RBC: 4.57 MIL/uL (ref 4.22–5.81)
RDW: 14.8 % (ref 11.5–15.5)
WBC: 5.7 10*3/uL (ref 4.0–10.5)

## 2010-11-19 LAB — GLUCOSE, CAPILLARY
Glucose-Capillary: 75 mg/dL (ref 70–99)
Glucose-Capillary: 82 mg/dL (ref 70–99)
Glucose-Capillary: 114 mg/dL — ABNORMAL HIGH (ref 70–99)

## 2010-11-19 LAB — PROCALCITONIN: Procalcitonin: 0.46 ng/mL

## 2010-11-20 DIAGNOSIS — G4730 Sleep apnea, unspecified: Secondary | ICD-10-CM

## 2010-11-20 DIAGNOSIS — G4710 Hypersomnia, unspecified: Secondary | ICD-10-CM

## 2010-11-20 LAB — CBC
HCT: 37.3 % — ABNORMAL LOW (ref 39.0–52.0)
Hemoglobin: 12.3 g/dL — ABNORMAL LOW (ref 13.0–17.0)
MCH: 28.1 pg (ref 26.0–34.0)
MCHC: 33 g/dL (ref 30.0–36.0)
MCV: 85.4 fL (ref 78.0–100.0)
Platelets: 233 10*3/uL (ref 150–400)
RBC: 4.37 MIL/uL (ref 4.22–5.81)
RDW: 14.4 % (ref 11.5–15.5)
WBC: 6.2 10*3/uL (ref 4.0–10.5)

## 2010-11-20 LAB — BASIC METABOLIC PANEL
BUN: 9 mg/dL (ref 6–23)
CO2: 33 mEq/L — ABNORMAL HIGH (ref 19–32)
Calcium: 7.9 mg/dL — ABNORMAL LOW (ref 8.4–10.5)
Chloride: 97 mEq/L (ref 96–112)
Creatinine, Ser: 0.57 mg/dL (ref 0.4–1.5)
GFR calc Af Amer: >60 mL/min (ref >60)
GFR calc non Af Amer: >60 mL/min (ref >60)
Glucose, Bld: 91 mg/dL (ref 70–99)
Potassium: 3.5 mEq/L (ref 3.5–5.1)
Sodium: 136 mEq/L (ref 135–145)

## 2010-11-22 ENCOUNTER — Encounter: Payer: Self-pay | Admitting: Internal Medicine

## 2010-11-22 DIAGNOSIS — J069 Acute upper respiratory infection, unspecified: Secondary | ICD-10-CM | POA: Insufficient documentation

## 2010-11-22 LAB — CULTURE, BLOOD (ROUTINE X 2)
Culture  Setup Time: 201205281423
Culture  Setup Time: 201205281945
Culture: NO GROWTH
Culture: NO GROWTH

## 2010-11-22 NOTE — Progress Notes (Signed)
  Subjective:    Patient ID: Scott Franklin, male    DOB: Oct 25, 1975, 35 y.o.   MRN: [id number]  HPI Patient presents to clinic for evaluation of cough. Notes 5 day history of nonproductive cough with nasal congestion. Denies fever chills or shortness of breath. Possible strep exposure. No alleviating or exacerbating factors. No other complaints.  Reviewed past medical history, medications and allergies  Review of Systems  Constitutional: Negative for fever and chills.  HENT: Positive for congestion and rhinorrhea.   Respiratory: Positive for cough. Negative for shortness of breath.   Skin: Negative for pallor and rash.       Objective:   Physical Exam    Physical Exam  Vitals reviewed. Constitutional:  appears well-developed and well-nourished. No distress.  HENT:  Head: Normocephalic and atraumatic.  Right Ear: Tympanic membrane, external ear and ear canal normal.  Left Ear: Tympanic membrane, external ear and ear canal normal.  Nose: Nose normal.  Mouth/Throat: Oropharynx is clear and moist. No oropharyngeal exudate.  Eyes: Conjunctivae and EOM are normal. Pupils are equal, round, and reactive to light. Right eye exhibits no discharge. Left eye exhibits no discharge. No scleral icterus.  Neck: Neck supple. No thyromegaly present.  Cardiovascular: Normal rate, regular rhythm and normal heart sounds.  Exam reveals no gallop and no friction rub.   No murmur heard. Pulmonary/Chest: Effort normal and breath sounds normal. No respiratory distress.  has no wheezes.  has no rales.  Lymphadenopathy:   no cervical adenopathy.  Neurological:  is alert.  Skin: Skin is warm and dry.  not diaphoretic.  Psychiatric: normal mood and affect.      Assessment & Plan:

## 2010-11-22 NOTE — Assessment & Plan Note (Signed)
Given possible strep exposure begin antibiotic therapy. Followup if no improvement or worsening.

## 2010-11-30 ENCOUNTER — Telehealth: Payer: Self-pay | Admitting: *Deleted

## 2010-11-30 NOTE — Telephone Encounter (Signed)
Pt states [employer] will be calling to get additional information related to STD forms completed by Dr. Cato Mulligan.  Pt states some of the information provided on the forms was incorrect.  I advised the patient to bring in form to ov on Thursday and discuss with Dr. Cato Mulligan at that time.

## 2010-12-01 ENCOUNTER — Telehealth: Payer: Self-pay | Admitting: Internal Medicine

## 2010-12-01 NOTE — Discharge Summary (Signed)
NAMEALBINO, Scott Franklin NO.:  [id number]  MEDICAL RECORD NO.:  [id number]  LOCATION:  2603                         FACILITY:  [facility]  PHYSICIAN:  Lonia Blood, M.D.DATE OF BIRTH:  1975-09-01  DATE OF ADMISSION:  11/16/2010 DATE OF DISCHARGE:  11/23/2010                              DISCHARGE SUMMARY   ADMITTING PHYSICIAN:  Dr. Ramiro Harvest, with [facility].  DISCHARGING PHYSICIAN:  Lonia Blood, MD  CONSULTANTS THIS ADMISSION:  Dr. Cyril Mourning, with [facility].  PRIMARY CARE PHYSICIAN:  Valetta Mole. Swords, MD  CHIEF COMPLAINT/REASON FOR ADMISSION:  Scott Franklin is a 35 year old white male patient with past medical history significant for morbid obesity, otherwise healthy gentleman who presented with an 8-day history of what appeared to be community-acquired pneumonia symptoms, worse over the past 2 days, failed Levaquin outpatient therapy.  Please refer to the history and physical exam for significant details regarding the preadmission history.  Upon arrival to the emergency department, the patient's room air saturation was 81%.  The patient was placed on the face mask and sats were 91%.  His white count was 17,100 procalcitonin level 0.86.  ProBNP was 244.  Sodium was low at 128, albumin 2.6. Urinalysis was concerning for possible UTI.  Lactic acid was mildly elevated at 1.7.  EKG demonstrated sinus tachycardia.  A chest x-ray showed mild edema with suspected left lower lung pneumonia.  The hospitalist were asked to admit the patient.  On initial exam, the patient was febrile with a temperature of 101.8, BP 99/72, pulse 125, respirations 28, room air sats 81% with respirations decreasing to normal level and saturations improving with the addition of Venti mask oxygen.  On clinical exam from a pulmonary standpoint, he had diminished breath sounds possibly due to large body habitus but he also was found to have  bilateral crackles and coarse upper airway respiratory sounds.  PAST MEDICAL HISTORY: 1. Morbid obesity. 2. Suspected undiagnosed sleep apnea.  ADMITTING DIAGNOSES: 1. Acute hypoxemic respiratory failure secondary to community-acquired     pneumonia, failed outpatient therapy with Levaquin. 2. Systemic inflammatory response syndrome, possible evolving sepsis     without true shock. 3. Possible volume overload based on chest x-ray. 4. Hyponatremia presumed secondary to volume overload. 5. Leukocytosis secondary to community-acquired pneumonia and SIRS. 6. Morbid obesity, suspected sleep apnea.  DIAGNOSTICS: 1. Portable chest x-ray single view pneumonia focus shows mild edema     and suspected left lower lobe pneumonia this was on May 28. 2. Portable chest x-ray on May 29 shows slightly patchy airspace     disease mid to lower lung zones may represent pulmonary edema     although infectious infiltrate not excluded.  A pass to projects     over the anterior aspect of the right second rib attention to on followup x-rays to help exclude underlying mass. 3. Portable chest x-ray on May 31 shows resolving pulmonary airspace     disease since prior exam.  No acute findings. 4. Transthoracic echocardiogram on May 29 shows normal left     ventricular cavity size, wall thickness with increased pattern of     mild LVH.  Normal systolic  function with estimated ejection     fraction 55%.  Due to obesity, images were inadequate for LV wall     motion assessment.  Indeterminate regarding diastolic dysfunction.     Majority of the cardiac structures were poorly visualized due to     body habitus.  Because no complete TR Doppler jet was able to be     obtained and they were unable to estimate PA systolic pressure in     the pulmonary arteries.  The systemic veins were noted to have an     IVC that measured 1.9 cm with normal respirophasic variation     suggesting right atrial pressures between 6 and  10 mmHg. LABORATORY:    Blood cultures x2 obtained on May 28 showed no growth.  Legionella     antigen urine was negative.  Urine culture showed no growth.     Urinary strep pneumonia was negative.  Cardiac isoenzymes were     cycled x3 and showed no evidence of ischemia.  Urine osmolality was     345.  Serum osmolality 271.  These were obtained in admission and     no further followup studies were obtained when the patient's sodium     rapidly normalized.  MRSA PCR screening was negative.  ABG at     presentation, pH 7.40, pCO2 49.5, P02 92, bicarb 30.7, pCO2 32, ABE     was 5.0, and O2 sat was 97%.  Followup ABG several hours later on     100% non-rebreather pH 7.318, pCO2 65.6, P02 84.8, bicarbonate     32.70, CO2 34.7, acid-base excess 6.5 and O2 sat 95.8%.  Blood gas     after achieving adequate treatment with BiPAP shows pH of 7.406,     pCO2 51.3, P02 61, bicarbonate 32.2, pCO2 34, ABE 6, and O2     saturation 90%.  Followup procalcitonin on May 31 is 0.46.  Most     recent laboratory values from June 1, white count 6200, hemoglobin     12.3, and platelets 233,000.  Sodium 136, potassium 3.5, chloride     97, CO2 of 33, glucose 91, BUN 9, and creatinine 0.57.  HOSPITAL COURSE: 1. Acute-on-chronic hypoxemic respiratory failure, multifactorial     secondary to community-acquired pneumonia, undiagnosed obstructive     sleep apnea with obesity hypoventilation syndrome.  The patient was     admitted to the ICU unit because of possible evolving SIRS as well     as borderline blood pressure for hypotension and severe hypoxemic     respiratory failure.  He had been treated empirically with Levaquin     for 8 days for community-acquired pneumonia as an outpatient     without any improvement in his symptoms and actually worsened and     became more lethargic prior to admission.  Therefore, he was     started on vancomycin and Primaxin.  From a respiratory standpoint,     the patient was  slow to improve and concerns over progression to a     full-blown sepsis were noted and therefore Pulmonary Critical Care     Medicine was called in consultation.  They agreed with the broad-     spectrum antibiotics and the patient did require continuous BiPAP     therapy for greater than 48 hours, but was able to improve and     avoid intubation.  Subsequently, the patient was able to be weaned  to daytime oxygen initially at 50% Venti mask and at the time of     discharge down to 4 liters per minute during the day as well as     nocturnal BiPAP utilizing Auto BiPAP 10-20 within EPAP of 5-10 with a bleed-in of 6 liters per minute oxygen.     as recommended by [facility].  Plans are     for the patient to return to home on these settings and follow up     with Dr. Vassie Loll, on an outpatient setting.  He will also be     scheduled for an outpatient sleep study to confirm the diagnosis of     obstructive sleep apnea and obesity hypoventilation syndrome.  From     a neurological standpoint after adequate treatment of the patient's     underlying respiratory failure and infection, his mental status has     returned to the baseline.  He has received a total of 7 days of     Primaxin therapy to treat recalcitrant community-acquired     pneumonia. 2. Suspected volume overload.  At presentation, there was some concern     that his chest x-ray had a pattern of volume overload.  The     patient's 2-D echo did not support this.  He also has lower     extremity edema most likely related to venous insufficiency.     ProBNP is also not likely to be a reliable indicator of volume     status in an obese patient.  The patient did not require diuresis     this admission. 3. Hyponatremia.  This resolved independent of any therapy.  Suspect     this is related to the patient's acute illness. 4. Morbid obesity/deconditioning.  The patient has had a protracted     hospital stay and given  his underlying obesity and underlying     hypoxemic respiratory failure.  He will need physical therapy,     occupational therapy after returns to home.  He also will need to     be out of work for an indeterminate amount of time.  We will defer     duration of sick leave to the patient's primary care physician Dr.     Timoteo Gaul with the input from Dr. Vassie Loll, the pulmonary critical care     doctor.  This determination will be made in the outpatient setting     after the patient has return to Dr. Cato Mulligan, for followup.     Regarding the patient's obesity, Nutrition consult was obtained     this admission both the patient and mother spoke with the     nutritionist and they have been given a recommendation that the     patient began with a 2500-calorie per day calorie restriction and     they will also utilize menus given to them by the nutritionist as     well as research menus online to assist in the patient's weight     loss.  In the past, the patient has seen a nutritionist, he is     going to psychotherapy and has been in a support groups.  All of     these have only yielded short-term benefits.  The patient is     reluctant to return to support groups since they are mainly populated     by male patients. 5. Hypertension.  The patient was having blood pressure readings once  sepsis and SIRS had resolved, that were consistent with possible     hypertension.  This later discovered the patient was having blood     pressures taken with a cuff that was too small for his large arm.     Once an appropriate size cuff was utilized the patient's blood     pressure were in the normotensive range.  DISPOSITION:  At the present time, the patient is appropriate for discharge to home.  FINAL DISCHARGE DIAGNOSES: 1. Acute-on-chronic hypoxemic respiratory failure, multifactorial     secondary to a community acquired pneumonia, failed outpatient     Levaquin therapy.  Obstructive sleep apnea with  associated obesity     hypoventilation syndrome. 2. Community-acquired pneumonia successfully treated with Primaxin x7     days. 3. Hyponatremia, resolved. 4. Morbid obesity and deconditioning.  FINAL DISCHARGE MEDICATIONS:  Aspirin 325 mg 2 tablets by mouth daily as needed for pain.  STOP TAKING THE FOLLOWING MEDICATIONS:  Levaquin 500 mg 1 tablet by mouth daily.  DISCHARGE INSTRUCTIONS:  RETURN TO WORK:    Please see note provided.  The patient's return to work will be as directed by Dr. Cato Mulligan and/or Dr. Vassie Loll, the pulmonologist.  ACTIVITY:  Increase activity slowly.  May shower.  PT/OT evaluation and followup with home health PT/OT.  DIET:  Low-sodium heart-healthy and as recommended by the nutritionist with a 2500-calorie per 24-hour restriction.  WOUND CARE:  You are to call if the pain or redness at the prior IV sites increases or does not resolve after 7 days.  You need to notify your family physician.  FOLLOWUP APPOINTMENTS: 1. Return to Dr. Vassie Loll, telephone number [phone] on December 21, 2010 at     1:30 p.m. 2. Followup with Dr. Cato Mulligan after discharge.  Please call to be seen     him in the next 1-2 weeks. 3. You have a sleep study scheduled on Tuesday December 15, 2010 at the     [facility].  You have been instructed to     arrive by 8 p.m.  Their telephone number is [phone].     Allison L. Rennis Harding, N.P.   ______________________________ Lonia Blood, M.D.    ALE/MEDQ  D:  11/23/2010  T:  11/23/2010  Job:  [id number]  cc:   Oretha Milch, MD Valetta Mole. Swords, MD  Electronically Signed by Junious Silk N.P. on 11/24/2010 07:48:53 AM Electronically Signed by Jetty Duhamel M.D. on 11/30/2010 09:51:01 PM

## 2010-12-01 NOTE — Telephone Encounter (Signed)
Fax machine is down, pt has appt on Thursday and Dr Cato Mulligan can address it then

## 2010-12-01 NOTE — Telephone Encounter (Signed)
Pt called and said that an order has been sent over by Physical Therapist, [place] Lyon with [facility]. Pt just want to check on status of order. Pls advise.

## 2010-12-02 NOTE — H&P (Signed)
[name], Franklin             ACCOUNT NO.:  [id number]  MEDICAL RECORD NO.:  [id number]           PATIENT TYPE:  E  LOCATION:  [facility]                         FACILITY:  [facility]  PHYSICIAN:  Ramiro Harvest, MD    DATE OF BIRTH:  1975/09/30  DATE OF ADMISSION:  11/16/2010 DATE OF DISCHARGE:                             HISTORY & PHYSICAL   PRIMARY CARE PHYSICIAN:  Valetta Mole. Swords, MD of [facility].  CHIEF COMPLAINT:  Shortness of breath.  HISTORY OF PRESENT ILLNESS:  Scott Franklin is a 35 year old white gentleman, who is morbidly obese with no significant past medical history presenting to the ED with an 8-day history of cough which has become more productive over the past 2 days which has been worsening with generalized weakness, fatigue, worsening shortness of breath, subjective fevers, nausea, and episode of diarrhea.  The patient presented to his PCP's office 4 days prior to admission and placed on oral Levaquin, however, has not had any significant improvement with worsening of his symptoms, subsequently presented to the ED.  The patient denies any chest pain.  No headache.  No abdominal pain.  No constipation.  No dysuria.  No focal neurological symptoms.  On arrival in the ED, the patient's sats were 81% on room air.  The patient was placed on a face mask.  His sats are now 97%.  CBC which was obtained did have an elevated white count of 17.1.  Procalcitonin level was 0.86. ProBNP was 244.1.  CMET with a sodium of 128 and calcium of 7.9 with albumin of 2.6.  UA was amber, turbid, specific gravity 1.030, nitrite negative, leukocytes negative.  Lactic acid level was 1.7.  EKG showed sinus tachycardia.  Chest x-ray showed mild edema, suspected left lung pneumonia.  We are called to admit the patient for further evaluation and management.  ALLERGIES:  PENICILLIN CAUSES ANGIOEDEMA.  PAST MEDICAL HISTORY:  Morbid obesity.  HOME MEDICATIONS:  He was on Levaquin  500 mg daily, started 4 days prior to admission.  FAMILY HISTORY:  Mother alive at age 21 and healthy.  Father deceased at age 20 from heart arrhythmia.  SOCIAL HISTORY:  No tobacco use.  No alcohol use.  No IV drug use.  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  REVIEW OF SYSTEMS:  As per HPI, otherwise negative.  PHYSICAL EXAM:  VITAL SIGNS:  Temperature 101.8, blood pressure 99/72, pulse of 125, respirations 28, satting 81% on room air. GENERAL:  The patient is a morbidly obese gentleman, speaking in full sentences. HEENT:  Normocephalic, atraumatic.  Pupils equal, round, and reactive to light and accommodation.  Extraocular movements intact.  Oropharynx is clear.  No lesions or exudates. NECK:  Supple.  No lymphadenopathy. RESPIRATORY:  Decreased breath sounds secondary to body habitus. Positive crackles.  Positive coarse breath sounds. CARDIOVASCULAR:  Tachycardic with regular rhythm. ABDOMEN:  Soft, nontender, nondistended.  Positive bowel sounds. EXTREMITIES:  No clubbing, cyanosis, or edema. NEUROLOGIC:  The patient is alert and oriented x3.  Cranial nerves II through XII are grossly intact.  LABORATORY DATA:  Admission labs, chest x-ray  shows mild edema, suspected left lung pneumonia.  ProBNP of 244.  Procalcitonin of 0.86. CBC with a white count of 17.1, hemoglobin 15.2, hematocrit 43.1, platelet count of 238, ANC of 14.5.  CMET; sodium 128, potassium 4.2, chloride 89, bicarb 29, glucose 122, BUN 16, creatinine 1.11, bilirubin of 0.3, alk phosphatase 53, AST 27, ALT 21, protein 7.0, albumin 2.6, calcium of 7.9.  UA is amber, turbid, specific gravity 1.030, pH 5.5, glucose 100, bilirubin, moderate ketones 15, blood trace, protein 100, urobilinogen 1.0, nitrite negative, leukocytes negative.  Lactic acid level of 1.7.  ABG on full face mask at 15 liters, pH of 7.4, pCO2 of 49, pO2 of 92, bicarb of 30, satting 97% on 15 liters.  EKG shows  sinus tachycardia.  ASSESSMENT AND PLAN:  Scott Franklin is a 35 year old morbidly obese gentleman presented to the ED with worsening shortness of breath, found to be in acute hypoxic respiratory failure. 1. Acute hypoxic respiratory failure secondary to sepsis secondary to     community-acquired pneumonia in the setting of volume overload.  We     will check blood cultures x2.  Cycle cardiac enzymes q.8 h x3.     Check a 2-D echo.  Check a urine Legionella and pneumococcus     antigen.  Check a lipase level.  Check a sputum Gram stain and     culture.  Place on O2 nebs, Tessalon Perles, empiric IV vancomycin,     and Primaxin.  We will consult with CCM for further evaluation and     management.  Also place on Lasix 60 mg IV q.12 h x2 doses. 2. Sepsis, likely secondary to community-acquired pneumonia.  The     patient has fevers, tachycardic, elevated white count, elevated     respiratory rate, satting 81% on room air on admission on a full     face mask now.  Check a sputum Gram stain and culture.  Check blood     cultures x2.  Check a urine Legionella and pneumococcus antigen.     Check a lipase.  Place on empiric IV vancomycin and Primaxin, O2     nebs, Tessalon Perles, and CCM consult. 3. Community-acquired pneumonia.  Check a sputum Gram stain and     culture.  Check a urine Legionella and pneumococcus antigen.  Place     on oxygen nebulizer treatments, Tessalon Perles, empiric IV     vancomycin and Primaxin. 4. Volume overload per chest x-ray and a clinical exam.  EKG with     sinus tachycardia.  We will cycle cardiac enzymes q.8 h x3.  Check     a 2-D echo.  Place on IV Lasix, O2 nebs, and follow. 5. Hyponatremia likely secondary to volume overload.  Check a urine     osmolality and serum osmolality, IV Lasix, follow. 6. Leukocytosis likely secondary to community-acquired pneumonia.  We     will panculture, place empirically on IV vancomycin and Primaxin. 7. Morbid  obesity. 8. Prophylaxis Protonix for GI prophylaxis, heparin for DVT     prophylaxis.  It has been a pleasure taking care of [name].     Ramiro Harvest, MD    DT/MEDQ  D:  11/16/2010  T:  11/16/2010  Job:  [id number]  cc:   Valetta Mole. Swords, MD  Electronically Signed by Ramiro Harvest MD on 12/02/2010 02:40:19 PM

## 2010-12-03 ENCOUNTER — Ambulatory Visit (INDEPENDENT_AMBULATORY_CARE_PROVIDER_SITE_OTHER): Payer: BC Managed Care – PPO | Admitting: Internal Medicine

## 2010-12-03 ENCOUNTER — Encounter: Payer: Self-pay | Admitting: Internal Medicine

## 2010-12-03 ENCOUNTER — Telehealth: Payer: Self-pay | Admitting: *Deleted

## 2010-12-03 DIAGNOSIS — J9600 Acute respiratory failure, unspecified whether with hypoxia or hypercapnia: Secondary | ICD-10-CM

## 2010-12-03 DIAGNOSIS — J9690 Respiratory failure, unspecified, unspecified whether with hypoxia or hypercapnia: Secondary | ICD-10-CM

## 2010-12-03 DIAGNOSIS — E669 Obesity, unspecified: Secondary | ICD-10-CM

## 2010-12-03 NOTE — Telephone Encounter (Signed)
Patient states he needs clarification on if he is to be out of work until he sees Dr. Vassie Loll in July.  Pt claims this was not clearly explained to him during ov today.

## 2010-12-03 NOTE — Progress Notes (Signed)
  Subjective:    Patient ID: Scott Franklin, male    DOB: 1976-05-15, 35 y.o.   MRN: [id number]  HPI Pt comes in with his mother for post hospital evaluation I have not seen the patient in several years Acute on chronic resp failure due to pneumonia and morbid obesity. He is on chronic O2 since leaving the hospital. He admits to ongoing fatigue and "tired".  He denies sleepiness. He describes an overall sensation of exhaustion when he took a shower (off of oxygen). He has seen Dr. Vassie Loll and Dr. Sharon Seller in the hospital. Reviewed hospital record and d/c summary, reviewed imaging studies. He tells me he has been recommended for aqua therapy for rehab... I have not seen the request.  He is scheduled for a sleep study.  He is interested in having disability forms reviewed... I have recently completed them.   Past Medical History  Diagnosis Date  . Panhypopituitarism   . Other testicular hypofunction   . ED (erectile dysfunction)    No past surgical history on file.  reports that he quit smoking about 13 years ago. He does not have any smokeless tobacco history on file. He reports that he does not drink alcohol or use illicit drugs. family history includes Hypertension in his father.  There is no history of Diabetes. Allergies  Allergen Reactions  . Amoxicillin     REACTION: throat swells      Review of Systems  patient denies chest pain, . Denies abdominal pain, change in appetite, change in bowel movements. Patient denies rashes, musculoskeletal complaints. No other specific complaints in a complete review of systems. He has chronic LE edema, chronic SOB exacerbated by any movement     Objective:   Physical Exam Massively obese male in NAD, wearing O2. Chest without increased work of breathing. CTA. Neck: supple, cannot identify jugular veins. Carotid upstroke normal. CV; REG rate without murmur. Abdomen---obese, soft , NT. Extr---1+edema bilaterally. Some discoloration consistent with  venous insufficiency. Neuro-alert. broadbased gait       Assessment & Plan:

## 2010-12-03 NOTE — Patient Instructions (Signed)
Beginning of current illness 11/12/10 at which time Mr. Candelas was seen for an acute illness.  Duration of disability at this time is unknown and depends on the opinion of pulmonologist , Dr. Cyril Mourning [phone]).

## 2010-12-03 NOTE — Assessment & Plan Note (Addendum)
This is clearly his most significant problem.  He is really not interested in diet or exercise plans. But He may be interested in nutrition follow up.   i had a prolonged conversation with patient and mother regarding diet. I am not hopeful that the patient will be successful with weight loss. He provides an excuse for most dietary alternatives. He should be on a very strict diet. i would recommend not exceeding 1500 Kcal/day.   He needs to become involved in some type of exercise program---he is considering aqua therapy.

## 2010-12-03 NOTE — Assessment & Plan Note (Signed)
Hospitalized June 2012 for acute on chronic resp failure Discharged on oxygen and CPAP. His main issue is morbid obesity---needs to lose weight---he understands Discussed with patient and mother

## 2010-12-03 NOTE — Telephone Encounter (Signed)
Ask him to read patient instructions given to him at OV. I will be in touch with Dr. Vassie Loll to see if patient can go back to work before his office visit

## 2010-12-04 NOTE — Telephone Encounter (Signed)
Pt aware, disability forms were due today and he is stumped as to when Dr Cato Mulligan will call Dr Vassie Loll.

## 2010-12-07 NOTE — Telephone Encounter (Signed)
Pt aware.

## 2010-12-07 NOTE — Telephone Encounter (Signed)
Dr. Vassie Loll  recommends staying out on disability until patient is seen by Dr. Vassie Loll

## 2010-12-14 ENCOUNTER — Telehealth: Payer: Self-pay | Admitting: Pulmonary Disease

## 2010-12-15 ENCOUNTER — Ambulatory Visit (HOSPITAL_BASED_OUTPATIENT_CLINIC_OR_DEPARTMENT_OTHER): Payer: BC Managed Care – PPO | Attending: Pulmonary Disease | Admitting: Pulmonary Disease

## 2010-12-15 DIAGNOSIS — G4730 Sleep apnea, unspecified: Secondary | ICD-10-CM

## 2010-12-15 DIAGNOSIS — G4733 Obstructive sleep apnea (adult) (pediatric): Secondary | ICD-10-CM | POA: Insufficient documentation

## 2010-12-15 NOTE — Telephone Encounter (Addendum)
Spoke with pt. He states Dr Vassie Loll saw him in the hospital for PNA and started him on nocturnal o2 and wanted him to stay on this until his next appt here which is 12/21/10. Pt is sched to have PSG done tonight and wants to know if he needs to use o2 during the study. RA out of the office until next wk so will forward to another sleep doc for recs. Please advise thanks!

## 2010-12-15 NOTE — Telephone Encounter (Signed)
OK to to the sleep study on O2 2 L/M per patient request.

## 2010-12-15 NOTE — Telephone Encounter (Signed)
Per Dr. Maple Hudson pt is ok to start sleep study on o2 at 2 lpm. Called denean and advised her to make note of this change. Called and spoke with patient and he is aware that study will be started out on o2 at 2 lpm.

## 2010-12-18 ENCOUNTER — Encounter: Payer: Self-pay | Admitting: Pulmonary Disease

## 2010-12-21 ENCOUNTER — Encounter: Payer: Self-pay | Admitting: Pulmonary Disease

## 2010-12-21 ENCOUNTER — Ambulatory Visit (INDEPENDENT_AMBULATORY_CARE_PROVIDER_SITE_OTHER): Payer: BC Managed Care – PPO | Admitting: Pulmonary Disease

## 2010-12-21 ENCOUNTER — Encounter: Payer: Self-pay | Admitting: *Deleted

## 2010-12-21 VITALS — BP 130/80 | HR 75 | Temp 98.5°F | Ht 68.5 in | Wt >= 6400 oz

## 2010-12-21 DIAGNOSIS — G4733 Obstructive sleep apnea (adult) (pediatric): Secondary | ICD-10-CM

## 2010-12-21 DIAGNOSIS — J9690 Respiratory failure, unspecified, unspecified whether with hypoxia or hypercapnia: Secondary | ICD-10-CM

## 2010-12-21 DIAGNOSIS — J9600 Acute respiratory failure, unspecified whether with hypoxia or hypercapnia: Secondary | ICD-10-CM

## 2010-12-21 DIAGNOSIS — Z9989 Dependence on other enabling machines and devices: Secondary | ICD-10-CM | POA: Insufficient documentation

## 2010-12-21 NOTE — Assessment & Plan Note (Signed)
Dc O2 OK to return to work

## 2010-12-21 NOTE — Progress Notes (Signed)
  Subjective:    Patient ID: Scott Franklin, male    DOB: 09-22-1975, 35 y.o.   MRN: [id number]  HPI [name], non smoker, for post hospital Fu visit. Adm 5/28-11/23/10 for CAP & hypoxemic resp failure requiring BiPAP.  It was felt that based on his excessive daytime  somnolence, loud snoring, and a history of witnessed apneas that he did  have obstructive sleep apnea.  He was discharged on auto BiPAP and 6  liters of oxygen blended in.  This study was performed about 3 weeks  after his discharge.  At the time of the study, he weighed 460 pounds  with a height of 5 feet 9 inches, BMI of 68, neck size 19.5 inches. PSg 12/15/10 showed severe OSA with AHI 98/h corrected by CPAP 12 cm. He did not require O2.  12/21/10 He appears much improved, did not desaturate on walking around the office. No cough, wheeze, comlpiant with BiPAP on report. Mom is pleased with his progress.   Review of Systems  Constitutional: Negative for fever, appetite change and unexpected weight change.  HENT: Negative for ear pain, congestion, sore throat, rhinorrhea, sneezing, trouble swallowing, dental problem and postnasal drip.   Eyes: Negative for redness.  Respiratory: Positive for shortness of breath. Negative for cough and wheezing.   Cardiovascular: Negative for chest pain, palpitations and leg swelling.  Gastrointestinal: Negative for nausea, vomiting, abdominal pain and diarrhea.  Genitourinary: Negative for dysuria and urgency.  Musculoskeletal: Positive for back pain. Negative for joint swelling.  Skin: Negative for rash.  Neurological: Negative for syncope and headaches.  Hematological: Does not bruise/bleed easily.  Psychiatric/Behavioral: Negative for dysphoric mood. The patient is not nervous/anxious.        Objective:   Physical Exam Gen. Pleasant, morbidly obese, in no distress, normal affect ENT - no lesions, no post nasal drip Neck: No JVD, no thyromegaly, no carotid bruits Lungs: no use of  accessory muscles, no dullness to percussion, decreased without rales or rhonchi  Cardiovascular: Rhythm regular, heart sounds  normal, no murmurs or gallops, no peripheral edema Abdomen: soft and non-tender, no hepatosplenomegaly, BS normal. Musculoskeletal: No deformities, no cyanosis or clubbing Neuro:  alert, non focal        Assessment & Plan:

## 2010-12-21 NOTE — Patient Instructions (Addendum)
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

## 2010-12-22 NOTE — Procedures (Signed)
Scott Franklin, Scott Franklin             ACCOUNT NO.:  [id number]  MEDICAL RECORD NO.:  [id number]          PATIENT TYPE:  OUT  LOCATION:  SLEEP CENTER                 FACILITY:  [facility]  PHYSICIAN:  Oretha Milch, MD      DATE OF BIRTH:  02/10/1976  DATE OF STUDY:  12/15/2010                           NOCTURNAL POLYSOMNOGRAM  REFERRING PHYSICIAN:  RAKESH V. ALVA  INDICATION FOR STUDY:  Scott Franklin is a 35 year old morbidly obese man who was hospitalized for pneumonia and hypoxemic respiratory failure, requiring BiPAP.  It was felt that based on his excessive daytime somnolence, loud snoring, and a history of witnessed apneas that he did have obstructive sleep apnea.  He was discharged on auto BiPAP and 6 liters of oxygen blended in.  This study was performed about 3 weeks after his discharge.  At the time of the study, he weighed 460 pounds with a height of 5 feet 9 inches, BMI of 68, neck size 19.5 inches.  EPWORTH SLEEPINESS SCORE:  16.  This intervention polysomnogram was performed with sleep technologist in attendance.  EEG, EOG, EMG, EKG, and respiratory parameters were recorded.  Sleep stages arousals, limb movements, respiratory data were scored according to criteria laid out by the American Academy of Sleep Medicine.  SLEEP ARCHITECTURE:  Lights out was at 10:43 p.m., lights on was at 4:57 a.m.  CPAP was initiated at 1:17 a.m.  During the diagnostic portion, total sleep time was 121 minutes with a sleep period time of 145 minutes and a sleep efficiency of 79%.  Sleep latency was 9 minutes.  Latency to REM sleep was 75 minutes.  Wake after sleep onset was 24 minutes.  Sleep stages as the percentage of total sleep time was N1 12%, N2 81%, N3 0%, REM sleep 7% (8.5 minutes).  During the titration portion, REM sleep accounted for 59 minutes.  Most of which was in supine position. Longest period of REM sleep was noted around 2 a.m.  RESPIRATORY DATA:  During the diagnostic portion, there  were a total of 55 obstructive apneas, 8 central apneas, 4 mixed apneas, and 132 hypopneas.  An apnea/hypopnea index of 99 events per hour and a lowest desaturation of 70%.  Due to this degree of respiratory disturbance, CPAP was initiated at 4 cm and titrated to a final level of 12 cm.  At this final level for 142 minutes including 20 minutes of REM sleep, 1 hypopnea was noted with an AHI of 0.4 events per hour and lowest desaturation of 87%.  Note that the sleep study was performed without supplemental oxygen.  AROUSAL DATA:  During the diagnostic portion, the arousal index was 14 events per hour, of 29 arousals for were spontaneous, the rest were associated with respiratory events.  Fewer arousals were noted during the CPAP of 12 cm.  OXYGEN DATA:  During the diagnostic portion, the lowest desaturation was 71% on CPAP of 12 cm, a lowest desaturation was 87%.  He spent 70 minutes with a saturation less than 88% during the titration portion.  CARDIAC DATA:  During the diagnostic portion, the low heart rate was 42 beats per minute and the high heart rate was 121 beats  per minute.  No arrhythmias were noted.  DISCUSSION:  He was desensitized with a small full-face ResMed Quattro mask, seemed to tolerate CPAP well.  MOVEMENT-PARASOMNIA:  No significant limb movements were noted.  IMPRESSIONS-RECOMMENDATIONS: 1. Severe obstructive sleep apnea with hypopneas causing sleep     fragmentation and oxygen desaturation. 2. This was corrected by CPAP of 12 cm.  No oxygen was applied during     the study. 3. No evidence of cardiac arrhythmias, limb movements, or behavioral     disturbance during sleep.  RECOMMENDATIONS: 1. The treatment option for this degree of sleep disordered breathing     include weight loss and CPAP therapy. 2. CPAP should be initiated at 12 cm with a small full-face mask.     Compliance should be monitored at this level. 3. He should be advised against medications  with sedative side     effects.  He should be cautioned against driving when sleepy.     Oretha Milch, MD Electronically Signed    RVA/MEDQ  D:  12/21/2010 13:12:23  T:  12/22/2010 02:07:40  Job:  [id number]

## 2010-12-23 NOTE — Assessment & Plan Note (Signed)
PSG 6/12 >> severe with AHI 98/h, correcetd by CPAP 12 cm, c flex 3  Dc BiPAP Start CPAP at above settings Weight loss encouraged, compliance with goal of at least 4-6 hrs every night is the expectation. Advised against medications with sedative side effects Cautioned against driving when sleepy - understanding that sleepiness will vary on a day to day basis

## 2011-02-12 ENCOUNTER — Ambulatory Visit: Payer: BC Managed Care – PPO | Admitting: Internal Medicine

## 2011-02-26 ENCOUNTER — Ambulatory Visit (INDEPENDENT_AMBULATORY_CARE_PROVIDER_SITE_OTHER): Payer: BC Managed Care – PPO | Admitting: Pulmonary Disease

## 2011-02-26 ENCOUNTER — Encounter: Payer: Self-pay | Admitting: Pulmonary Disease

## 2011-02-26 DIAGNOSIS — Z23 Encounter for immunization: Secondary | ICD-10-CM

## 2011-02-26 DIAGNOSIS — E669 Obesity, unspecified: Secondary | ICD-10-CM

## 2011-02-26 DIAGNOSIS — G4733 Obstructive sleep apnea (adult) (pediatric): Secondary | ICD-10-CM

## 2011-02-26 NOTE — Assessment & Plan Note (Addendum)
Nutrition referral.

## 2011-02-26 NOTE — Progress Notes (Signed)
  Subjective:    Patient ID: Scott Franklin, male    DOB: [birth year], 35 y.o.   MRN: [id number]  HPI [name], non smoker, for FU of severe obstructive sleep apnea  Adm 5/28-11/23/10 for CAP & hypoxemic resp failure requiring BiPAP.  He reported his excessive daytime somnolence, loud snoring, and  witnessed apneas  He was discharged on auto BiPAP and 6 liters of oxygen blended in. This study was performed about 3 weeks after his discharge. At the time of the study, he weighed 460 pounds with a height of 5 feet 9 inches, BMI of 68, neck size 19.5 inches. PSg 12/15/10 showed severe OSA with AHI 98/h corrected by CPAP 12 cm. He did not require O2.   12/21/10  He appears much improved, did not desaturate on walking around the office.  No cough, wheeze, comlpiant with BiPAP on report. Mom is pleased with his progress.  02/26/2011 Pt here for follow up. c/o nasal congestion, sore throat, sneezing, coughing/throat clearing x 3 days and dry mouth in the mornings Download on 12 cm  Shows large leak, AHI 12/h, excellent compliance He feels better rested   Review of Systems Pt denies any significant  nasal congestion or excess secretions, fever, chills, sweats, unintended wt loss, pleuritic or exertional cp, orthopnea pnd or leg swelling.  Pt also denies any obvious fluctuation in symptoms with weather or environmental change or other alleviating or aggravating factors.    Pt denies any increase in rescue therapy over baseline, denies waking up needing it or having early am exacerbations or coughing/wheezing/ or dyspnea      Objective:   Physical Exam Gen. Pleasant, obese, in no distress ENT - no lesions, no post nasal drip, class 3 airway Neck: No JVD, no thyromegaly, no carotid bruits Lungs: no use of accessory muscles, no dullness to percussion, clear without rales or rhonchi  Cardiovascular: Rhythm regular, heart sounds  normal, no murmurs or gallops, no peripheral edema Musculoskeletal: No  deformities, no cyanosis or clubbing          Assessment & Plan:

## 2011-02-26 NOTE — Assessment & Plan Note (Signed)
Some residual events persist, this is likley due to loss of pressure from leak. Hopefully, tightening the straps will give him a better seal, otherwise he may have to shave off his beard. We will recheck download to see if leak is resolved  Weight loss encouraged - nutrition consult., compliance with goal of at least 4-6 hrs every night is the expectation. Advised against medications with sedative side effects Cautioned against driving when sleepy - understanding that sleepiness will vary on a day to day basis

## 2011-02-26 NOTE — Patient Instructions (Signed)
You will adjust for leak We will check a download in 1 month & give you feedback  Weight loss

## 2011-04-07 ENCOUNTER — Telehealth: Payer: Self-pay | Admitting: *Deleted

## 2011-04-07 NOTE — Telephone Encounter (Signed)
Pt is having RLQ pain with vomiting x 12 hours.  Advised to go to the ER ASAP to rule out appendicitis.

## 2011-05-28 ENCOUNTER — Telehealth: Payer: Self-pay | Admitting: *Deleted

## 2011-05-28 NOTE — Telephone Encounter (Signed)
Pt walked in and advised he was called and told to bring in chip from machine. Was able to retreive download from same 12/24/10-05/28/11. Placed in RA's look at. Will hold phone note for results.

## 2011-06-15 ENCOUNTER — Telehealth: Payer: Self-pay | Admitting: Pulmonary Disease

## 2011-06-15 NOTE — Telephone Encounter (Signed)
He has significant leak on CPAP 12 cm download 7/5-12/7 ? Related to beard  Quite a few residual events  Pl ensure he has appt within next month

## 2011-06-17 NOTE — Telephone Encounter (Signed)
I informed pt of RA's findings and recommendations. Pt verbalized understanding. Has appointment 06/25/11 with RA

## 2011-06-17 NOTE — Telephone Encounter (Signed)
See 06/15/11 phone note created by RA.

## 2011-06-18 ENCOUNTER — Encounter: Payer: Self-pay | Admitting: Pulmonary Disease

## 2011-06-25 ENCOUNTER — Ambulatory Visit (INDEPENDENT_AMBULATORY_CARE_PROVIDER_SITE_OTHER): Payer: BC Managed Care – PPO | Admitting: Pulmonary Disease

## 2011-06-25 ENCOUNTER — Encounter: Payer: Self-pay | Admitting: Pulmonary Disease

## 2011-06-25 VITALS — BP 148/102 | HR 91 | Temp 98.2°F | Ht 68.5 in | Wt >= 6400 oz

## 2011-06-25 DIAGNOSIS — Z9989 Dependence on other enabling machines and devices: Secondary | ICD-10-CM

## 2011-06-25 DIAGNOSIS — G4733 Obstructive sleep apnea (adult) (pediatric): Secondary | ICD-10-CM

## 2011-06-25 NOTE — Assessment & Plan Note (Addendum)
Take off beard New mask - try to decrease leak Repeat download in 1 month If leak better, will increase pressure to see if this will decrease events. If nothing works, repeat in lab titration 

## 2011-06-25 NOTE — Progress Notes (Signed)
  Subjective:    Patient ID: Scott Franklin, male    DOB: 12-06-1975, 36 y.o.   MRN: [id number]  HPI 35/M, non smoker, for FU of severe obstructive sleep apnea  Adm 5/28-11/23/10 for CAP & hypoxemic resp failure requiring BiPAP.  He reported his excessive daytime somnolence, loud snoring, and witnessed apneas He was discharged on auto BiPAP and 6 liters of oxygen blended in. PSG was performed about 3 weeks after his discharge. At the time of the study, he weighed 460 pounds with a height of 5 feet 9 inches, BMI of 68, neck size 19.5 inches. PSg 12/15/10 showed severe OSA with AHI 98/h corrected by CPAP 12 cm. He did not require O2.   02/26/2011 Download on 12 cm Shows large leak, AHI 12/h, excellent compliance  He feels better rested  06/25/2011 Pt states he wears his cpap machine everynight x 6-8 hrs a night. Pt denies any problems w/ machine/mask. Pt states he feels rested after using cpap He has significant leak on CPAP 12 cm download 7/5-12/7 ? Related to beard  Quite a few residual events , about 30/h Download over dec '12 shows similar findings, usage good. Residual events appear to be obstructive rather than central - so doubt he has complex sleep apnea    Review of Systems Patient denies significant dyspnea,cough, hemoptysis,  chest pain, palpitations, pedal edema, orthopnea, paroxysmal nocturnal dyspnea, lightheadedness, nausea, vomiting, abdominal or  leg pains      Objective:   Physical Exam Gen. Pleasant, obese, in no distress ENT - no lesions, no post nasal drip Neck: No JVD, no thyromegaly, no carotid bruits Lungs: no use of accessory muscles, no dullness to percussion, decreased without rales or rhonchi  Cardiovascular: Rhythm regular, heart sounds  normal, no murmurs or gallops, no peripheral edema Musculoskeletal: No deformities, no cyanosis or clubbing , no tremors        Assessment & Plan:

## 2011-06-25 NOTE — Patient Instructions (Signed)
Take off beard New mask - try to decrease leak Repeat download in 1 month If leak better, will increase pressure to see if this will decrease events. If nothing works, repeat in lab titration

## 2011-08-20 ENCOUNTER — Telehealth: Payer: Self-pay | Admitting: Pulmonary Disease

## 2011-08-20 NOTE — Telephone Encounter (Signed)
?   Is she a patient here? LMTCB

## 2011-08-23 NOTE — Telephone Encounter (Signed)
I advised pt [name]. Carron Curie, CMA

## 2011-09-24 ENCOUNTER — Ambulatory Visit (INDEPENDENT_AMBULATORY_CARE_PROVIDER_SITE_OTHER): Payer: BC Managed Care – PPO | Admitting: Pulmonary Disease

## 2011-09-24 ENCOUNTER — Encounter: Payer: Self-pay | Admitting: Pulmonary Disease

## 2011-09-24 VITALS — BP 134/92 | HR 107 | Temp 98.7°F | Ht 70.0 in | Wt >= 6400 oz

## 2011-09-24 DIAGNOSIS — J209 Acute bronchitis, unspecified: Secondary | ICD-10-CM | POA: Insufficient documentation

## 2011-09-24 MED ORDER — HYDROCODONE-HOMATROPINE 5-1.5 MG/5ML PO SYRP: 5.0000 mL | ORAL_SOLUTION | Freq: Four times a day (QID) | ORAL | Status: AC | PRN

## 2011-09-24 NOTE — Assessment & Plan Note (Signed)
The patient's history is most consistent with a developing acute bronchitis/URI.  He is having no respiratory distress, his oxygen saturations are at his usual baseline, and his lungs are totally clear to auscultation.  He is also afebrile.  I suspect this is viral in origin, and we'll therefore treat symptoms.  We'll give him antitussives to help with his cough during the day and also his cough that is keeping him awake at night.  The most important thing is for him to call us if he begins to bring up purulent mucus, or if he has worsening shortness of breath or fever.

## 2011-09-24 NOTE — Patient Instructions (Signed)
Would use tylenol cold and sinus to help with symptoms Will give you hycodan cough syrup to help with your cough during day and while trying to sleep If you begin to cough up discolored mucus, please call us If you feel hot or chilled, please take your temperature, and call us if greater than 100.5. followup with Dr. Vassie Loll for your sleep apnea at the recommended time.

## 2011-09-24 NOTE — Progress Notes (Signed)
  Subjective:    Patient ID: Scott Franklin, male    DOB: Nov 18, 1975, 36 y.o.   MRN: [id number]  HPI The patient comes in today for an acute sick visit.  He is usually followed by Dr. Vassie Loll for management of obstructive sleep apnea.  He gives a 3 to four-day history of cough along with chest congestion, but is not bringing up any mucus at this time.  He has a little bit of a runny nose, but is not blowing any purulence from his nose.  He does not really have a sore throat at this time.  He is having a little more shortness of breath than his normal baseline, but denies any chest pain.  He has had some subjective chills, but has not taken his temperature.   Review of Systems  Constitutional: Positive for fever. Negative for unexpected weight change.  HENT: Positive for congestion, sore throat, rhinorrhea, sneezing, postnasal drip and sinus pressure. Negative for ear pain, nosebleeds, trouble swallowing and dental problem.   Eyes: Positive for redness and itching.  Respiratory: Positive for cough, chest tightness, shortness of breath and wheezing.   Cardiovascular: Negative for palpitations and leg swelling.  Gastrointestinal: Positive for nausea, vomiting and diarrhea.  Genitourinary: Negative for dysuria.  Musculoskeletal: Negative for joint swelling.  Skin: Negative for rash.  Neurological: Positive for headaches.  Hematological: Does not bruise/bleed easily.  Psychiatric/Behavioral: Negative for dysphoric mood. The patient is not nervous/anxious.        Objective:   Physical Exam Morbidly obese male in no acute distress Nose without purulence or discharge noted Oropharynx with no exudates or erythema noted Neck with no lymphadenopathy or tenderness Chest with clear breath sounds throughout, no crackles or wheezes noted Cardiac exam with regular rate and rhythm Lower extremities with edema noted, no cyanosis Alert and oriented, moves all 4 extremities.       Assessment & Plan:

## 2011-10-08 ENCOUNTER — Ambulatory Visit (INDEPENDENT_AMBULATORY_CARE_PROVIDER_SITE_OTHER): Payer: BC Managed Care – PPO | Admitting: Pulmonary Disease

## 2011-10-08 ENCOUNTER — Encounter: Payer: Self-pay | Admitting: Pulmonary Disease

## 2011-10-08 VITALS — BP 122/80 | HR 80 | Temp 98.6°F | Ht 69.0 in | Wt >= 6400 oz

## 2011-10-08 DIAGNOSIS — Z9989 Dependence on other enabling machines and devices: Secondary | ICD-10-CM

## 2011-10-08 DIAGNOSIS — G4733 Obstructive sleep apnea (adult) (pediatric): Secondary | ICD-10-CM

## 2011-10-08 NOTE — Patient Instructions (Signed)
New mask does decrease leak somewhat Increase CPAP to 15 cm & get download in1 month & lets discuss after

## 2011-10-08 NOTE — Progress Notes (Signed)
  Subjective:    Patient ID: Scott Franklin, male    DOB: 1975-10-07, 36 y.o.   MRN: [id number]  HPI  35/M, non smoker, for FU of severe obstructive sleep apnea  Adm 5/28-11/23/10 for CAP & hypoxemic resp failure requiring BiPAP.  He reported his excessive daytime somnolence, loud snoring, and witnessed apneas He was discharged on auto BiPAP and 6 liters of oxygen blended in. PSG was performed about 3 weeks after his discharge. At the time of the study, he weighed 460 pounds with a height of 5 feet 9 inches, BMI of 68, neck size 19.5 inches. PSg 12/15/10 showed severe OSA with AHI 98/h corrected by CPAP 12 cm. He did not require O2.   02/26/2011 Download on 12 cm Shows large leak, AHI 12/h, excellent compliance  He feels better rested  10/08/2011 Had interim OV for cough - given hycodan cough syrup, bronchitis is resolved  Pt states he wears his cpap machine everynight x 6-8 hrs a night. Pt denies any problems w/ machine/mask. Pt states he feels rested after using cpap He has significant leak on CPAP 12 cm download 7/5-12/7/12 ? Related to beard  Quite a few residual events , about 30/h Download over dec '12 shows similar findings, usage good. Download 3-4/13 shows AHI 34/h, leak 40l/min, good usage- even after trimming beard     Review of Systems  Patient denies significant dyspnea,cough, hemoptysis,  chest pain, palpitations, pedal edema, orthopnea, paroxysmal nocturnal dyspnea, lightheadedness, nausea, vomiting, abdominal or  leg pains      Objective:   Physical Exam  Gen. Pleasant, obese, in no distress ENT - no lesions, no post nasal drip Neck: No JVD, no thyromegaly, no carotid bruits Lungs: no use of accessory muscles, no dullness to percussion, decreased without rales or rhonchi  Cardiovascular: Rhythm regular, heart sounds  normal, no murmurs or gallops, no peripheral edema Musculoskeletal: No deformities, no cyanosis or clubbing , no tremors        Assessment & Plan:

## 2011-10-08 NOTE — Assessment & Plan Note (Addendum)
New mask does decrease leak somewhat Residual events appear to be obstructive rather than central - so doubt he has complex sleep apnea Increase CPAP to 15 cm or auto 12- 15 & get download in1 month & lets discuss after If events persist, may have to retitrate in the lab & consider bipap  Weight loss encouraged, compliance with goal of at least 4-6 hrs every night is the expectation. Advised against medications with sedative side effects Cautioned against driving when sleepy - understanding that sleepiness will vary on a day to day basis

## 2011-11-11 ENCOUNTER — Telehealth: Payer: Self-pay | Admitting: Family Medicine

## 2011-11-11 NOTE — Telephone Encounter (Signed)
This pt called and wants to change from Dr. Cato Mulligan to Dr. Kirtland Bouchard. I sent message to both physicians. Do not make any PCP changes without further advisement. Thanks.

## 2011-11-12 NOTE — Telephone Encounter (Signed)
Dr. Kirtland Bouchard has declined the transfer.

## 2011-12-30 ENCOUNTER — Telehealth: Payer: Self-pay | Admitting: Pulmonary Disease

## 2011-12-30 NOTE — Telephone Encounter (Signed)
Download on 15 cm 6/13 still shows significant residual events - would recommend biPAP titration in the sleep lab OK to order if he is willing

## 2011-12-31 ENCOUNTER — Encounter: Payer: Self-pay | Admitting: *Deleted

## 2011-12-31 NOTE — Telephone Encounter (Signed)
Received a message the # is no longer in service will send him letter in the mail

## 2012-01-05 ENCOUNTER — Encounter: Payer: Self-pay | Admitting: Pulmonary Disease

## 2012-01-12 ENCOUNTER — Encounter: Payer: Self-pay | Admitting: *Deleted

## 2012-01-12 NOTE — Telephone Encounter (Signed)
Still receiving  message the # is no longer in service. Will send another letter out to pt and will forward to RA letting him know still have not been able to reach pt.

## 2012-02-18 ENCOUNTER — Encounter: Payer: Self-pay | Admitting: Pulmonary Disease

## 2012-02-18 ENCOUNTER — Ambulatory Visit (INDEPENDENT_AMBULATORY_CARE_PROVIDER_SITE_OTHER): Payer: BC Managed Care – PPO | Admitting: Pulmonary Disease

## 2012-02-18 VITALS — BP 146/90 | HR 97 | Temp 98.3°F | Ht 69.0 in | Wt >= 6400 oz

## 2012-02-18 DIAGNOSIS — G4733 Obstructive sleep apnea (adult) (pediatric): Secondary | ICD-10-CM

## 2012-02-18 DIAGNOSIS — E669 Obesity, unspecified: Secondary | ICD-10-CM

## 2012-02-18 NOTE — Progress Notes (Signed)
  Subjective:    Patient ID: Scott Franklin, male    DOB: 1976-02-20, 36 y.o.   MRN: [id number]  HPI  36/M, non smoker, for FU of severe obstructive sleep apnea  Adm 5/28-11/23/10 for CAP & hypoxemic resp failure requiring BiPAP.  He reported his excessive daytime somnolence, loud snoring, and witnessed apneas He was discharged on auto BiPAP and 6 liters of oxygen blended in. PSG was performed about 3 weeks after his discharge. At the time of the study, he weighed 460 pounds with a height of 5 feet 9 inches, BMI of 68, neck size 19.5 inches. PSg 12/15/10 showed severe OSA with AHI 98/h corrected by CPAP 12 cm. He did not require O2.  02/26/2011 Download on 12 cm Shows large leak, AHI 12/h, excellent compliance  He feels better rested   10/08/2011  Had interim OV for cough - given hycodan cough syrup, bronchitis is resolved  Pt states he wears his cpap machine everynight x 6-8 hrs a night.  He has significant leak on CPAP 12 cm download 7/5-12/7/12 ? Related to beard  Quite a few residual events , about 30/h  Download over dec '12 shows similar findings, usage good.  Download 3-4/13 shows AHI 34/h, leak 40l/min, good usage- even after trimming beard     02/18/2012  Pt denies any problems w/ machine/mask. Pt states he feels rested after using cpap After last visit, Increased CPAP to 15 cm Download on 15 cm 6/13 showed significant residual events Rpt download 8/13 shows residual AHi 7/h, good compliance - delayed sleep phase late bedtime 3am , late wake up 11 am - goes to work only at [employer]   Review of Systems neg for any significant sore throat, dysphagia, itching, sneezing, nasal congestion or excess/ purulent secretions, fever, chills, sweats, unintended wt loss, pleuritic or exertional cp, hempoptysis, orthopnea pnd or change in chronic leg swelling. Also denies presyncope, palpitations, heartburn, abdominal pain, nausea, vomiting, diarrhea or change in bowel or urinary habits,  dysuria,hematuria, rash, arthralgias, visual complaints, headache, numbness weakness or ataxia.     Objective:   Physical Exam  Gen. Pleasant, obese, in no distress ENT - no lesions, no post nasal drip Neck: No JVD, no thyromegaly, no carotid bruits Lungs: no use of accessory muscles, no dullness to percussion, decreased without rales or rhonchi  Cardiovascular: Rhythm regular, heart sounds  normal, no murmurs or gallops, no peripheral edema Musculoskeletal: No deformities, no cyanosis or clubbing , no tremors        Assessment & Plan:

## 2012-02-18 NOTE — Assessment & Plan Note (Signed)
Stay on CPAP 15 cm - events seem to be finally controlled on this level Weight loss encouraged, compliance with goal of at least 6 hrs every night is the expectation. Advised against medications with sedative side effects Cautioned against driving when sleepy - understanding that sleepiness will vary on a day to day basis  If events persist or recur, may need bipap titration  He has delayed sleep phase syndrome -If  interested in changing this -sun- light exposure in am x 30 mins & melatonin 3h prior to bedtime

## 2012-02-18 NOTE — Assessment & Plan Note (Signed)
Weight loss advised  Not ready for bariatric surgery Wishes to change PCP - will request

## 2012-02-18 NOTE — Patient Instructions (Addendum)
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

## 2012-04-06 ENCOUNTER — Ambulatory Visit (INDEPENDENT_AMBULATORY_CARE_PROVIDER_SITE_OTHER): Payer: BC Managed Care – PPO | Admitting: Internal Medicine

## 2012-04-06 ENCOUNTER — Other Ambulatory Visit (INDEPENDENT_AMBULATORY_CARE_PROVIDER_SITE_OTHER): Payer: BC Managed Care – PPO

## 2012-04-06 ENCOUNTER — Encounter: Payer: Self-pay | Admitting: Internal Medicine

## 2012-04-06 VITALS — BP 124/86 | HR 95 | Temp 98.2°F | Resp 16 | Ht 68.0 in | Wt >= 6400 oz

## 2012-04-06 DIAGNOSIS — E291 Testicular hypofunction: Secondary | ICD-10-CM

## 2012-04-06 DIAGNOSIS — Z23 Encounter for immunization: Secondary | ICD-10-CM

## 2012-04-06 DIAGNOSIS — E230 Hypopituitarism: Secondary | ICD-10-CM

## 2012-04-06 DIAGNOSIS — D649 Anemia, unspecified: Secondary | ICD-10-CM

## 2012-04-06 DIAGNOSIS — Z0000 Encounter for general adult medical examination without abnormal findings: Secondary | ICD-10-CM

## 2012-04-06 LAB — VITAMIN B12: Vitamin B-12: 383 pg/mL (ref 211–911)

## 2012-04-06 LAB — URINALYSIS, ROUTINE W REFLEX MICROSCOPIC
Bilirubin Urine: NEGATIVE
Ketones, ur: NEGATIVE
Leukocytes, UA: NEGATIVE
Nitrite: NEGATIVE
Specific Gravity, Urine: 1.025 (ref 1.000–1.030)
Total Protein, Urine: NEGATIVE
Urine Glucose: NEGATIVE
Urobilinogen, UA: 0.2 (ref 0.0–1.0)
pH: 6 (ref 5.0–8.0)

## 2012-04-06 LAB — COMPREHENSIVE METABOLIC PANEL
ALT: 23 U/L (ref 0–53)
AST: 19 U/L (ref 0–37)
Albumin: 3.7 g/dL (ref 3.5–5.2)
Alkaline Phosphatase: 56 U/L (ref 39–117)
BUN: 15 mg/dL (ref 6–23)
CO2: 27 mEq/L (ref 19–32)
Calcium: 8.7 mg/dL (ref 8.4–10.5)
Chloride: 103 mEq/L (ref 96–112)
Creatinine, Ser: 0.9 mg/dL (ref 0.4–1.5)
GFR: 105.43 mL/min (ref >60.00)
Glucose, Bld: 126 mg/dL — ABNORMAL HIGH (ref 70–99)
Potassium: 4 mEq/L (ref 3.5–5.1)
Sodium: 138 mEq/L (ref 135–145)
Total Bilirubin: 0.6 mg/dL (ref 0.3–1.2)
Total Protein: 8 g/dL (ref 6.0–8.3)

## 2012-04-06 LAB — CBC WITH DIFFERENTIAL/PLATELET
Basophils Absolute: 0.1 10*3/uL (ref 0.0–0.1)
Basophils Relative: 0.6 % (ref 0.0–3.0)
Eosinophils Absolute: 0.2 10*3/uL (ref 0.0–0.7)
Eosinophils Relative: 2.1 % (ref 0.0–5.0)
HCT: 40.9 % (ref 39.0–52.0)
Hemoglobin: 13.7 g/dL (ref 13.0–17.0)
Lymphocytes Relative: 19.7 % (ref 12.0–46.0)
Lymphs Abs: 1.8 10*3/uL (ref 0.7–4.0)
MCHC: 33.5 g/dL (ref 30.0–36.0)
MCV: 83.7 fl (ref 78.0–100.0)
Monocytes Absolute: 0.5 10*3/uL (ref 0.1–1.0)
Monocytes Relative: 5.7 % (ref 3.0–12.0)
Neutro Abs: 6.4 10*3/uL (ref 1.4–7.7)
Neutrophils Relative %: 71.9 % (ref 43.0–77.0)
Platelets: 340 10*3/uL (ref 150.0–400.0)
RBC: 4.88 Mil/uL (ref 4.22–5.81)
RDW: 14 % (ref 11.5–14.6)
WBC: 8.9 10*3/uL (ref 4.5–10.5)

## 2012-04-06 LAB — IBC PANEL
Iron: 98 ug/dL (ref 42–165)
Saturation Ratios: 28.6 % (ref 20.0–50.0)
Transferrin: 244.6 mg/dL (ref 212.0–360.0)

## 2012-04-06 LAB — LIPID PANEL
Cholesterol: 176 mg/dL (ref 0–200)
HDL: 39.3 mg/dL (ref >39.00)
LDL Cholesterol: 123 mg/dL — ABNORMAL HIGH (ref 0–99)
Total CHOL/HDL Ratio: 4
Triglycerides: 68 mg/dL (ref 0.0–149.0)
VLDL: 13.6 mg/dL (ref 0.0–40.0)

## 2012-04-06 LAB — FERRITIN: Ferritin: 234.3 ng/mL (ref 22.0–322.0)

## 2012-04-06 LAB — TSH: TSH: 3.16 u[IU]/mL (ref 0.35–5.50)

## 2012-04-06 LAB — FOLATE: Folate: 9.8 ng/mL (ref >5.9)

## 2012-04-06 LAB — HEMOGLOBIN A1C: Hgb A1c MFr Bld: 6.1 % (ref 4.6–6.5)

## 2012-04-06 MED ORDER — LORCASERIN HCL 10 MG PO TABS: 1.0000 | ORAL_TABLET | Freq: Two times a day (BID) | ORAL | Status: DC

## 2012-04-06 NOTE — Assessment & Plan Note (Signed)
Exam done, vaccines were updated, labs ordered, pt ed material was given 

## 2012-04-06 NOTE — Assessment & Plan Note (Signed)
Testosterone level today and ENDO f/up

## 2012-04-06 NOTE — Assessment & Plan Note (Signed)
Labs today and he needs an updated ENDO f/up

## 2012-04-06 NOTE — Assessment & Plan Note (Signed)
I will check his CBC today and will look at his vitamin levels

## 2012-04-06 NOTE — Progress Notes (Signed)
Subjective:    Patient ID: Scott Franklin, male    DOB: 1975/11/26, 36 y.o.   MRN: [id number]  Anemia Presents for follow-up visit. Symptoms include malaise/fatigue. There has been no abdominal pain, anorexia, bruising/bleeding easily, confusion, fever, leg swelling, light-headedness, pallor, palpitations, paresthesias, pica or weight loss. Signs of blood loss that are not present include hematemesis, hematochezia and melena.      Review of Systems  Constitutional: Positive for malaise/fatigue and fatigue. Negative for fever, chills, weight loss, diaphoresis, activity change, appetite change and unexpected weight change.  HENT: Negative.   Eyes: Negative.   Respiratory: Positive for apnea. Negative for cough, choking, chest tightness, shortness of breath, wheezing and stridor.   Cardiovascular: Negative for chest pain, palpitations and leg swelling.  Gastrointestinal: Negative for nausea, vomiting, abdominal pain, diarrhea, constipation, blood in stool, melena, hematochezia, anorexia and hematemesis.  Genitourinary: Negative.   Musculoskeletal: Negative.   Skin: Negative for color change, pallor, rash and wound.  Neurological: Negative.  Negative for light-headedness and paresthesias.  Hematological: Negative for adenopathy. Does not bruise/bleed easily.  Psychiatric/Behavioral: Negative.  Negative for confusion.       Objective:   Physical Exam  Vitals reviewed. Constitutional: He is oriented to person, place, and time. He appears well-developed and well-nourished. No distress.  HENT:  Head: Normocephalic and atraumatic.  Mouth/Throat: Oropharynx is clear and moist. No oropharyngeal exudate.  Eyes: Conjunctivae normal are normal. Right eye exhibits no discharge. Left eye exhibits no discharge. No scleral icterus.  Neck: Normal range of motion. Neck supple. No JVD present. No tracheal deviation present. No thyromegaly present.  Cardiovascular: Normal rate, regular rhythm, normal  heart sounds and intact distal pulses.  Exam reveals no gallop and no friction rub.   No murmur heard. Pulmonary/Chest: Effort normal and breath sounds normal. No stridor. No respiratory distress. He has no wheezes. He has no rales. He exhibits no tenderness.  Abdominal: Soft. Bowel sounds are normal. He exhibits no distension and no mass. There is no tenderness. There is no rebound and no guarding. Hernia confirmed negative in the right inguinal area and confirmed negative in the left inguinal area.  Genitourinary: Prostate normal and testes normal. Rectal exam shows internal hemorrhoid. Rectal exam shows no external hemorrhoid, no fissure, no mass, no tenderness and anal tone normal. Guaiac negative stool. Prostate is not enlarged and not tender. Right testis shows no mass, no swelling and no tenderness. Right testis is descended. Left testis shows no mass, no swelling and no tenderness. Left testis is descended. Uncircumcised. No phimosis, paraphimosis, hypospadias, penile erythema or penile tenderness. No discharge found.       I could not find his penis  Musculoskeletal: Normal range of motion. He exhibits no edema and no tenderness.  Lymphadenopathy:    He has no cervical adenopathy.       Right: No inguinal adenopathy present.       Left: No inguinal adenopathy present.  Neurological: He is alert and oriented to person, place, and time. He has normal reflexes. He displays normal reflexes. No cranial nerve deficit. He exhibits normal muscle tone. Coordination normal.  Skin: Skin is warm and dry. No rash noted. He is not diaphoretic. No erythema. No pallor.  Psychiatric: He has a normal mood and affect. His behavior is normal. Judgment and thought content normal.     Lab Results  Component Value Date   WBC 6.2 11/20/2010   HGB 12.3* 11/20/2010   HCT 37.3* 11/20/2010   PLT 233  11/20/2010   GLUCOSE 91 11/20/2010   CHOL 189 04/03/2007   TRIG 82 04/03/2007   HDL 32.7* 04/03/2007   LDLCALC 140*  04/03/2007   ALT 21 11/16/2010   AST 27 11/16/2010   NA 136 11/20/2010   K 3.5 11/20/2010   CL 97 11/20/2010   CREATININE 0.57 11/20/2010   BUN 9 11/20/2010   CO2 33* 11/20/2010   TSH 4.14 04/03/2007   INR 1.28 11/16/2010       Assessment & Plan:

## 2012-04-06 NOTE — Assessment & Plan Note (Signed)
Start belviq and check labs today to look for secondary causes and complications

## 2012-04-06 NOTE — Patient Instructions (Signed)
Health Maintenance, Males A healthy lifestyle and preventative care can promote health and wellness.  Maintain regular health, dental, and eye exams.  Eat a healthy diet. Foods like vegetables, fruits, whole grains, low-fat dairy products, and lean protein foods contain the nutrients you need without too many calories. Decrease your intake of foods high in solid fats, added sugars, and salt. Get information about a proper diet from your caregiver, if necessary.  Regular physical exercise is one of the most important things you can do for your health. Most adults should get at least 150 minutes of moderate-intensity exercise (any activity that increases your heart rate and causes you to sweat) each week. In addition, most adults need muscle-strengthening exercises on 2 or more days a week.   Maintain a healthy weight. The body mass index (BMI) is a screening tool to identify possible weight problems. It provides an estimate of body fat based on height and weight. Your caregiver can help determine your BMI, and can help you achieve or maintain a healthy weight. For adults 20 years and older:  A BMI below 18.5 is considered underweight.  A BMI of 18.5 to 24.9 is normal.  A BMI of 25 to 29.9 is considered overweight.  A BMI of 30 and above is considered obese.  Maintain normal blood lipids and cholesterol by exercising and minimizing your intake of saturated fat. Eat a balanced diet with plenty of fruits and vegetables. Blood tests for lipids and cholesterol should begin at age 20 and be repeated every 5 years. If your lipid or cholesterol levels are high, you are over 50, or you are a high risk for heart disease, you may need your cholesterol levels checked more frequently.Ongoing high lipid and cholesterol levels should be treated with medicines, if diet and exercise are not effective.  If you smoke, find out from your caregiver how to quit. If you do not use tobacco, do not start.  If you  choose to drink alcohol, do not exceed 2 drinks per day. One drink is considered to be 12 ounces (355 mL) of beer, 5 ounces (148 mL) of wine, or 1.5 ounces (44 mL) of liquor.  Avoid use of street drugs. Do not share needles with anyone. Ask for help if you need support or instructions about stopping the use of drugs.  High blood pressure causes heart disease and increases the risk of stroke. Blood pressure should be checked at least every 1 to 2 years. Ongoing high blood pressure should be treated with medicines if weight loss and exercise are not effective.  If you are 45 to 36 years old, ask your caregiver if you should take aspirin to prevent heart disease.  Diabetes screening involves taking a blood sample to check your fasting blood sugar level. This should be done once every 3 years, after age 45, if you are within normal weight and without risk factors for diabetes. Testing should be considered at a younger age or be carried out more frequently if you are overweight and have at least 1 risk factor for diabetes.  Colorectal cancer can be detected and often prevented. Most routine colorectal cancer screening begins at the age of 50 and continues through age 75. However, your caregiver may recommend screening at an earlier age if you have risk factors for colon cancer. On a yearly basis, your caregiver may provide home test kits to check for hidden blood in the stool. Use of a small camera at the end of a tube,   to directly examine the colon (sigmoidoscopy or colonoscopy), can detect the earliest forms of colorectal cancer. Talk to your caregiver about this at age 50, when routine screening begins. Direct examination of the colon should be repeated every 5 to 10 years through age 75, unless early forms of pre-cancerous polyps or small growths are found.  Hepatitis C blood testing is recommended for all people born from 1945 through 1965 and any individual with known risks for hepatitis C.  Healthy  men should no longer receive prostate-specific antigen (PSA) blood tests as part of routine cancer screening. Consult with your caregiver about prostate cancer screening.  Testicular cancer screening is not recommended for adolescents or adult males who have no symptoms. Screening includes self-exam, caregiver exam, and other screening tests. Consult with your caregiver about any symptoms you have or any concerns you have about testicular cancer.  Practice safe sex. Use condoms and avoid high-risk sexual practices to reduce the spread of sexually transmitted infections (STIs).  Use sunscreen with a sun protection factor (SPF) of 30 or greater. Apply sunscreen liberally and repeatedly throughout the day. You should seek shade when your shadow is shorter than you. Protect yourself by wearing long sleeves, pants, a wide-brimmed hat, and sunglasses year round, whenever you are outdoors.  Notify your caregiver of new moles or changes in moles, especially if there is a change in shape or color. Also notify your caregiver if a mole is larger than the size of a pencil eraser.  A one-time screening for abdominal aortic aneurysm (AAA) and surgical repair of large AAAs by sound wave imaging (ultrasonography) is recommended for ages 65 to 75 years who are current or former smokers.  Stay current with your immunizations. Document Released: 12/04/2007 Document Revised: 08/30/2011 Document Reviewed: 11/02/2010 [employer] Patient Information 2013 [employer].  

## 2012-04-07 ENCOUNTER — Encounter: Payer: Self-pay | Admitting: Internal Medicine

## 2012-04-07 ENCOUNTER — Telehealth: Payer: Self-pay | Admitting: Internal Medicine

## 2012-04-07 LAB — TESTOSTERONE, FREE, TOTAL, SHBG
Sex Hormone Binding: 23 nmol/L (ref 13–71)
Testosterone, Free: 32.5 pg/mL — ABNORMAL LOW (ref 47.0–244.0)
Testosterone-% Free: 2.3 % (ref 1.6–2.9)
Testosterone: 143.55 ng/dL — ABNORMAL LOW (ref 300–890)

## 2012-04-07 NOTE — Telephone Encounter (Signed)
Called pt to schedule apt with Dr.Ellison.  Lvmom.  Will try back soon if call is not returned.  Thanks!

## 2012-04-07 NOTE — Telephone Encounter (Signed)
Patient scheduled 04/21/2012 with Dr Everardo All

## 2012-04-21 ENCOUNTER — Ambulatory Visit (INDEPENDENT_AMBULATORY_CARE_PROVIDER_SITE_OTHER): Payer: BC Managed Care – PPO | Admitting: Endocrinology

## 2012-04-21 ENCOUNTER — Encounter: Payer: Self-pay | Admitting: Endocrinology

## 2012-04-21 VITALS — BP 138/82 | HR 87 | Temp 99.1°F | Wt >= 6400 oz

## 2012-04-21 DIAGNOSIS — E291 Testicular hypofunction: Secondary | ICD-10-CM

## 2012-04-21 DIAGNOSIS — E230 Hypopituitarism: Secondary | ICD-10-CM

## 2012-04-21 MED ORDER — CLOMIPHENE CITRATE 50 MG PO TABS: ORAL_TABLET | ORAL | Status: DC

## 2012-04-21 NOTE — Patient Instructions (Addendum)
i have sent a prescription to your pharmacy, for the "clomiphine," to help the testosterone level.   Let's check an ultrasound of the scrotum, and mri of the pituitary.  you will receive a phone call, about a day and time for an appointment for each.   Please come back for a follow-up appointment in 6 weeks.   normalization of testosterone is not known to harm you.  however, there are "theoretical" risks, including increased fertility, hair loss, prostate cancer, benign prostate enlargement, blood clots, liver problems, lower hdl ("good cholesterol"), sleep apnea, and behavior changes.

## 2012-04-21 NOTE — Progress Notes (Signed)
Subjective:    Patient ID: Scott Franklin, male    DOB: 10/01/1975, 35 y.o.   MRN: [id number]  HPI i saw this pt in 2008, for idiopathic central hypogonadism.  He was rx'ed with clomid, but he took this for only a brief time.  He lost his insurance, and did not ret for f/u until now.  He denies numbness of the feet, but he has has "lack of motivation."  He has assoc ED sxs.  Past Medical History  Diagnosis Date  . Panhypopituitarism   . Other testicular hypofunction   . ED (erectile dysfunction)   . Cellulitis   . Testosterone deficiency   . Sebaceous cyst   . Obesity   . Anemia   . Depression   . Diabetes mellitus without complication   . Hypertension     No past surgical history on file.  History   Social History  . Marital Status: Single    Spouse Name: N/A    Number of Children: N/A  . Years of Education: N/A   Occupational History  . student    Social History Main Topics  . Smoking status: Former Smoker -- 0.5 packs/day for 3 years    Types: Cigarettes    Quit date: 06/21/1997  . Smokeless tobacco: Never Used  . Alcohol Use: No  . Drug Use: No  . Sexually Active: Not Currently   Other Topics Concern  . Not on file   Social History Narrative  . No narrative on file    Current Outpatient Prescriptions on File Prior to Visit  Medication Sig Dispense Refill  . Lorcaserin HCl (BELVIQ) 10 MG TABS Take 1 tablet by mouth 2 (two) times daily.  30 tablet  0    Allergies  Allergen Reactions  . Amoxicillin     REACTION: throat swells    Family History  Problem Relation Age of Onset  . Hypertension Father   . Heart attack Father   . Hypertrophic cardiomyopathy Father   . Diabetes Neg Hx     BP 138/82  Pulse 87  Temp 99.1 F (37.3 C) (Oral)  Wt 503 lb (228.159 kg)  SpO2 95%   Review of Systems denies polyuria, depression, seizure, decreased urinary stream, fever, headache, easy bruising, rash, blurry vision, rhinorrhea, and chest pain.  He has  depression, doe, generalized muscle strength, gynecomastia,  and chronic weight gain Objective:   Physical Exam VS: see vs page GEN: no distress HEAD: head: no deformity eyes: no periorbital swelling, no proptosis external nose and ears are normal mouth: no lesion seen NECK: supple, thyroid is not enlarged CHEST WALL: no deformity LUNGS: clear to auscultation BREASTS:  bilat pseudogynecomastia CV: reg rate and rhythm, no murmur ABD: abdomen is soft, nontender.  no hepatosplenomegaly.  not distended.  no hernia GENITALIA:  Normal male, except the the penis is non-visible, and non-palpable MUSCULOSKELETAL: muscle bulk and strength are grossly normal.  no obvious joint swelling.  gait is normal and steady EXTEMITIES: no deformity.  Trace bilat leg edema.   NEURO:  cn 2-12 grossly intact.   readily moves all 4's.  sensation is intact to touch on all 4's.   SKIN:  Normal texture and temperature.  No rash or suspicious lesion is visible.   NODES:  None palpable at the neck PSYCH: alert, oriented x3.  Does not appear anxious nor depressed.  Lab Results  Component Value Date   TESTOSTERONE 143.55* 04/06/2012      Assessment & Plan:  Idiopathic central hypogonadism, needs increased rx Nonpalpable penis, prob due to obesity, but we should make sure that is all. Sleep apnea.  Normalization of testosterone could worsen this

## 2012-04-23 ENCOUNTER — Other Ambulatory Visit: Payer: Self-pay | Admitting: Endocrinology

## 2012-04-23 DIAGNOSIS — E230 Hypopituitarism: Secondary | ICD-10-CM

## 2012-05-30 ENCOUNTER — Ambulatory Visit (INDEPENDENT_AMBULATORY_CARE_PROVIDER_SITE_OTHER): Payer: BC Managed Care – PPO | Admitting: Endocrinology

## 2012-05-30 VITALS — BP 132/70 | HR 92 | Temp 97.8°F | Wt >= 6400 oz

## 2012-05-30 DIAGNOSIS — E230 Hypopituitarism: Secondary | ICD-10-CM

## 2012-05-30 LAB — TESTOSTERONE: Testosterone: 500.18 ng/dL (ref 300–890)

## 2012-05-30 NOTE — Patient Instructions (Addendum)
blood tests are being requested for you today.  We'll contact you with results.   Please return in 1 year.   

## 2012-05-30 NOTE — Progress Notes (Signed)
  Subjective:    Patient ID: Scott Franklin, male    DOB: 18-Mar-1976, 36 y.o.   MRN: [id number]  HPI Pt returns for f/u of idiopathic central hypogonadism (dx'ed 2008).  He was rx'ed with clomid as rx'ed. pt states he feels well in general, except for weight gain.  He says ED sxs are improved.  He says he was was unable to do MRI and [place] due to his weight.  Past Medical History  Diagnosis Date  . Panhypopituitarism   . Other testicular hypofunction   . ED (erectile dysfunction)   . Cellulitis   . Testosterone deficiency   . Sebaceous cyst   . Obesity   . Anemia   . Depression   . Diabetes mellitus without complication   . Hypertension     No past surgical history on file.  History   Social History  . Marital Status: Single    Spouse Name: N/A    Number of Children: N/A  . Years of Education: N/A   Occupational History  . student    Social History Main Topics  . Smoking status: Former Smoker -- 0.5 packs/day for 3 years    Types: Cigarettes    Quit date: 06/21/1997  . Smokeless tobacco: Never Used  . Alcohol Use: No  . Drug Use: No  . Sexually Active: Not Currently   Other Topics Concern  . Not on file   Social History Narrative  . No narrative on file    Current Outpatient Prescriptions on File Prior to Visit  Medication Sig Dispense Refill  . clomiPHENE (CLOMID) 50 MG tablet 1/4 tab daily  10 tablet  5    Allergies  Allergen Reactions  . Amoxicillin     REACTION: throat swells    Family History  Problem Relation Age of Onset  . Hypertension Father   . Heart attack Father   . Hypertrophic cardiomyopathy Father   . Diabetes Neg Hx     BP 132/70  Pulse 92  Temp 97.8 F (36.6 C) (Oral)  Wt 516 lb (234.056 kg)  SpO2 95%  Review of Systems Denies decreased urinary stream    Objective:   Physical Exam VITAL SIGNS:  See vs page GENERAL: no distress Ext: 1+ bilat leg edema.     Assessment & Plan:  Idiopathic central hypogonadism, on  clomid

## 2012-06-01 ENCOUNTER — Telehealth: Payer: Self-pay | Admitting: *Deleted

## 2012-06-01 NOTE — Telephone Encounter (Signed)
Message copied by Elnora Morrison on Thu Jun 01, 2012  9:08 AM ------      Message from: Romero Belling      Created: Wed May 31, 2012  7:39 AM       please call patient:      Testosterone is normal--good      Please continue the same medications      Please return in 1 year.

## 2012-06-01 NOTE — Telephone Encounter (Signed)
Patient notified of testosterone lab results and to continue same medications. Follow up with Dr. Everardo All in one year.

## 2012-08-18 ENCOUNTER — Ambulatory Visit: Payer: BC Managed Care – PPO | Admitting: Adult Health

## 2012-12-25 ENCOUNTER — Other Ambulatory Visit: Payer: Self-pay | Admitting: Endocrinology

## 2012-12-26 ENCOUNTER — Other Ambulatory Visit: Payer: Self-pay | Admitting: *Deleted

## 2012-12-26 MED ORDER — CLOMIPHENE CITRATE 50 MG PO TABS: ORAL_TABLET | ORAL | Status: DC

## 2013-09-10 ENCOUNTER — Other Ambulatory Visit: Payer: Self-pay

## 2013-09-10 MED ORDER — CLOMIPHENE CITRATE 50 MG PO TABS: ORAL_TABLET | ORAL | Status: DC

## 2013-10-12 ENCOUNTER — Telehealth: Payer: Self-pay

## 2013-10-12 MED ORDER — CLOMIPHENE CITRATE 50 MG PO TABS: ORAL_TABLET | ORAL | Status: DC

## 2013-10-12 NOTE — Telephone Encounter (Signed)
Done

## 2013-10-12 NOTE — Telephone Encounter (Signed)
Received fax from pharmacy request a refill on clomid. PT has not been seen since 2013.  Please advise, Thanks!

## 2013-10-12 NOTE — Telephone Encounter (Signed)
Refill x 1 Ov is due 

## 2013-10-23 ENCOUNTER — Telehealth: Payer: Self-pay | Admitting: Pulmonary Disease

## 2013-10-23 NOTE — Telephone Encounter (Signed)
I do not have anything on pt ? CMN. Will forward to Alida to see if she has anything.  Please advise Alida thanks   --pt aware we are working on this

## 2013-10-24 NOTE — Telephone Encounter (Signed)
I do [facility] have [facility]. I have emailed liaision Sheron NightingaleJason Pierce and Henderson NewcomerMelissa Stenson in ref to this, and have called patient about the same.

## 2013-10-26 ENCOUNTER — Telehealth: Payer: Self-pay | Admitting: Pulmonary Disease

## 2013-10-26 DIAGNOSIS — G4733 Obstructive sleep apnea (adult) (pediatric): Secondary | ICD-10-CM

## 2013-10-26 DIAGNOSIS — Z9989 Dependence on other enabling machines and devices: Principal | ICD-10-CM

## 2013-10-26 NOTE — Telephone Encounter (Signed)
Pt has not been seen since 2013. RA is it okay to send RX for supplies? thanks

## 2013-10-28 NOTE — Telephone Encounter (Signed)
Needs OV - with TP ok

## 2013-10-29 NOTE — Telephone Encounter (Signed)
Spoke with pt. appt scheduled and order placed. Staff message also [facility] to [facility].

## 2013-11-02 ENCOUNTER — Ambulatory Visit (INDEPENDENT_AMBULATORY_CARE_PROVIDER_SITE_OTHER): Payer: BC Managed Care – PPO | Admitting: Adult Health

## 2013-11-02 ENCOUNTER — Encounter: Payer: Self-pay | Admitting: Adult Health

## 2013-11-02 VITALS — BP 134/84 | HR 85 | Temp 98.9°F | Ht 68.0 in | Wt >= 6400 oz

## 2013-11-02 DIAGNOSIS — Z9989 Dependence on other enabling machines and devices: Principal | ICD-10-CM

## 2013-11-02 DIAGNOSIS — G4733 Obstructive sleep apnea (adult) (pediatric): Secondary | ICD-10-CM

## 2013-11-02 NOTE — Patient Instructions (Signed)
Stay on CPAP 15 cm Keep up good work on weight loss  follow up Dr. Vassie LollAlva  In 1 year and As needed

## 2013-11-02 NOTE — Assessment & Plan Note (Signed)
OSA well compensated on CPAP Download shows good usage and control with AHI at 1.1  Cont to work on wt loss  Refills of mask and supplies rx given.  follow up 1 year.

## 2013-11-02 NOTE — Progress Notes (Signed)
  Subjective:    Patient ID: Scott Franklin, male    DOB: 1976/04/07, 38 y.o.   MRN: [id number]  HPI 37/M, non smoker, for FU of severe obstructive sleep apnea  Adm 5/28-11/23/10 for CAP & hypoxemic resp failure requiring BiPAP.  He reported his excessive daytime somnolence, loud snoring, and witnessed apneas He was discharged on auto BiPAP and 6 liters of oxygen blended in. PSG was performed about 3 weeks after his discharge. At the time of the study, he weighed 460 pounds with a height of 5 feet 9 inches, BMI of 68, neck size 19.5 inches. PSg 12/15/10 showed severe OSA with AHI 98/h corrected by CPAP 12 cm. He did not require O2.  02/26/2011 Download on 12 cm Shows large leak, AHI 12/h, excellent compliance  He feels better rested   10/08/2011  Had interim OV for cough - given hycodan cough syrup, bronchitis is resolved  Pt states he wears his cpap machine everynight x 6-8 hrs a night.  He has significant leak on CPAP 12 cm download 7/5-12/7/12 ? Related to beard  Quite a few residual events , about 30/h  Download over dec '12 shows similar findings, usage good.  Download 3-4/13 shows AHI 34/h, leak 40l/min, good usage- even after trimming beard   02/18/12 Follow up   Pt denies any problems w/ machine/mask. Pt states he feels rested after using cpap After last visit, Increased CPAP to 15 cm Download on 15 cm 6/13 showed significant residual events Rpt download 8/13 shows residual AHi 7/h, good compliance - delayed sleep phase late bedtime 3am , late wake up 11 am - goes to work only at 1pm  11/02/2013 Follow up OSA  Pt denies any problems w/ machine/mask. Pt states he feels rested after using cpap CPAP 15 cm  Download 1-10/2013  shows residual AHi 1/h, good compliance  Feels more rested, less daytime sleepiness.  Wears all night on avg 6.5-8 .5 hr .  "have to wear everynight" feels much better.  Does have irritation along hairline from strap-discussed using cushion to protect this area.   Needs new mask/supplies. Has minimal mask leakage.     Review of Systems  neg for any significant sore throat, dysphagia, itching, sneezing, nasal congestion or excess/ purulent secretions, fever, chills, sweats, unintended wt loss, pleuritic or exertional cp, hempoptysis, orthopnea pnd or change in chronic leg swelling. Also denies presyncope, palpitations, heartburn, abdominal pain, nausea, vomiting, diarrhea or change in bowel or urinary habits, dysuria,hematuria, rash, arthralgias, visual complaints, headache, numbness weakness or ataxia.     Objective:   Physical Exam   Gen. Pleasant, obese, in no distress ENT - no lesions, no post nasal drip Neck: No JVD, no thyromegaly, no carotid bruits Lungs: no use of accessory muscles, no dullness to percussion, decreased without rales or rhonchi  Cardiovascular: Rhythm regular, heart sounds  normal, no murmurs or gallops, no peripheral edema Musculoskeletal: No deformities, no cyanosis or clubbing , no tremors        Assessment & Plan:

## 2013-11-05 NOTE — Progress Notes (Signed)
Reviewed & agree with plan  

## 2014-01-21 ENCOUNTER — Telehealth: Payer: Self-pay | Admitting: Endocrinology

## 2014-01-21 MED ORDER — CLOMIPHENE CITRATE 50 MG PO TABS: ORAL_TABLET | ORAL | Status: DC

## 2014-01-21 NOTE — Telephone Encounter (Signed)
Pt needs refill on clomid he has an appt on 02/11/14. Can we call in a bridge rx for him until his appt

## 2014-01-21 NOTE — Telephone Encounter (Signed)
1 month supply sent, Pt advised.

## 2014-01-25 ENCOUNTER — Encounter: Payer: Self-pay | Admitting: Internal Medicine

## 2014-01-25 ENCOUNTER — Ambulatory Visit (INDEPENDENT_AMBULATORY_CARE_PROVIDER_SITE_OTHER): Payer: BC Managed Care – PPO | Admitting: Internal Medicine

## 2014-01-25 ENCOUNTER — Other Ambulatory Visit: Payer: BC Managed Care – PPO

## 2014-01-25 VITALS — BP 134/78 | HR 94 | Temp 98.4°F | Wt >= 6400 oz

## 2014-01-25 DIAGNOSIS — L03221 Cellulitis of neck: Secondary | ICD-10-CM

## 2014-01-25 DIAGNOSIS — L0211 Cutaneous abscess of neck: Secondary | ICD-10-CM

## 2014-01-25 MED ORDER — MUPIROCIN 2 % EX OINT: TOPICAL_OINTMENT | CUTANEOUS | Status: DC

## 2014-01-25 MED ORDER — DOXYCYCLINE HYCLATE 100 MG PO TABS: 100.0000 mg | ORAL_TABLET | Freq: Two times a day (BID) | ORAL | Status: DC

## 2014-01-25 MED ORDER — TRAMADOL HCL 50 MG PO TABS
50.0000 mg | ORAL_TABLET | Freq: Three times a day (TID) | ORAL | Status: DC | PRN
Start: 2014-01-25 — End: 2014-02-21

## 2014-01-25 NOTE — Progress Notes (Signed)
   Subjective:    Patient ID: Scott Franklin, male    DOB: 03-Jun-1976, 38 y.o.   MRN: [id number]  HPI   He has noted an increasing mass along the lateral aspect of the neck on the left since 01/18/14. It has become larger and more tender. It developed into a lump over a span of 24 hours and then became a "cluster of lumps". He questions possible fever on Monday 8/3.  He's noticed some slick, clear fluid draining from the lesion.  When it first developed he had some minor irritation of throat but this resolved in < 24 hours. He's used aspirin and pain relievers with minimal response    Review of Systems Frontal headache, facial pain , nasal purulence, dental pain, sore throat , otic pain or otic discharge denied. No chills or sweats.     Objective:   Physical Exam   General appearance:morbid obesity; no acute distress or increased work of breathing is present.  No  lymphadenopathy about the head, neck, or axilla noted.   Eyes: No conjunctival inflammation or lid edema is present. There is no scleral icterus.  Ears:  External ear exam shows no significant lesions or deformities.  Otoscopic examination reveals clear canals, tympanic membranes are intact bilaterally with some minor scarring but without bulging, retraction, inflammation or discharge.  Nose:  External nasal examination shows no deformity or inflammation. Nasal mucosa are pink and moist without lesions or exudates. No septal dislocation or deviation.No obstruction to airflow.   Oral exam: Dental hygiene is good; lips and gums are healthy appearing.There is no oropharyngeal erythema or exudate noted.   Neck:  No deformities, thyromegaly, masses, or tenderness noted.   Supple with full range of motion without pain.   Heart:  Normal rate and regular rhythm. S1 and S2 normal without gallop, murmur, click, rub or other extra sounds.   Lungs:Chest clear auscultation; no wheezes, rhonchi,rales ,or rubs present.No increased  work of breathing.    Extremities:  No cyanosis, edema, or clubbing  noted    Skin: Abscess with central pustule 22 x 12 mm left neck.  Faint erythema over the forehead and maxillary areas which by history is related to wearing a sleep apnea mask suggesting contact dermatitis.  The lesion was cleaned with Betadine and alcohol and the pustule denuded to allow culture to be collected.       Assessment & Plan:  #1 neck abscess  #2 amoxicillin allergy  Plan: See orders and recommendations

## 2014-01-25 NOTE — Patient Instructions (Signed)
Dip gauze in  sterile saline and applied to the wound twice a day. Cover the wound with Telfa , non stick dressing  without any antibiotic ointment. The saline can be purchased at the drugstore or you can make your own .Boil cup of salt in a gallon of water. Store mixture  in a clean container.Report Warning  signs as discussed (red streaks, pus, fever, increasing pain). 

## 2014-01-25 NOTE — Progress Notes (Signed)
Pre visit review using our clinic review tool, if applicable. No additional management support is needed unless otherwise documented below in the visit note. 

## 2014-01-28 LAB — WOUND CULTURE

## 2014-02-11 ENCOUNTER — Ambulatory Visit (INDEPENDENT_AMBULATORY_CARE_PROVIDER_SITE_OTHER): Payer: BC Managed Care – PPO | Admitting: Endocrinology

## 2014-02-11 VITALS — BP 120/78 | HR 94 | Temp 98.1°F | Ht 68.0 in | Wt >= 6400 oz

## 2014-02-11 DIAGNOSIS — E291 Testicular hypofunction: Secondary | ICD-10-CM

## 2014-02-11 NOTE — Progress Notes (Signed)
   Subjective:    Patient ID: Scott Franklin, male    DOB: [birth year], 38 y.o.   MRN: [id number]  HPI Pt returns for f/u of idiopathic central hypogonadism (dx'ed 2008; he was rx'ed with clomid; he was was unable to do MRI and [place] due to his weight). pt states he feels well in general.  He has normal muscle strength. Depression and sleep apnea are well-controlled.  He declines weight-loss surgery.   Past Medical History  Diagnosis Date  . Panhypopituitarism   . Other testicular hypofunction   . ED (erectile dysfunction)   . Cellulitis   . Testosterone deficiency   . Sebaceous cyst   . Obesity   . Anemia   . Depression   . Diabetes mellitus without complication   . Hypertension     No past surgical history on file.  History   Social History  . Marital Status: Single    Spouse Name: N/A    Number of Children: N/A  . Years of Education: N/A   Occupational History  . student    Social History Main Topics  . Smoking status: Former Smoker -- 0.50 packs/day for 3 years    Types: Cigarettes    Quit date: 06/21/1997  . Smokeless tobacco: Never Used  . Alcohol Use: No  . Drug Use: No  . Sexual Activity: Not Currently   Other Topics Concern  . Not on file   Social History Narrative  . No narrative on file    Current Outpatient Prescriptions on File Prior to Visit  Medication Sig Dispense Refill  . doxycycline (VIBRA-TABS) 100 MG tablet Take 1 tablet (100 mg total) by mouth 2 (two) times daily.  14 tablet  0  . mupirocin ointment (BACTROBAN) 2 % Applied twice a day to the affected area;NOT into eyes.  22 g  0  . traMADol (ULTRAM) 50 MG tablet Take 1 tablet (50 mg total) by mouth every 8 (eight) hours as needed.  30 tablet  0   No current facility-administered medications on file prior to visit.    Allergies  Allergen Reactions  . Amoxicillin     REACTION: throat swells    Family History  Problem Relation Age of Onset  . Hypertension Father   . Heart attack  Father   . Hypertrophic cardiomyopathy Father   . Diabetes Neg Hx     BP 120/78  Pulse 94  Temp(Src) 98.1 F (36.7 C) (Oral)  Ht  (1.727 m)  Wt 497 lb (225.438 kg)  BMI 75.59 kg/m2  SpO2 94%  Review of Systems Denies ED sxs.      Objective:   Physical Exam VITAL SIGNS:  See vs page GENERAL: no distress.  Morbid obesity.  GENITALIA: Normal male testicles, scrotum, and penis.     Lab Results  Component Value Date   TESTOSTERONE 592 02/11/2014      Assessment & Plan:  Hypogonadism: overcontrolled. Morbid obesity: this exacerbates hypogonadism, but he declines surgery.   Patient is advised the following: Patient Instructions  blood tests are being requested for you today.  We'll contact you with results. normalization of testosterone is not known to harm you.  however, there are "theoretical" risks, including increased fertility, hair loss, prostate cancer, benign prostate enlargement, blood clots, liver problems, lower hdl ("good cholesterol"), sleep apnea, and behavior changes. Please return in 1 year.

## 2014-02-11 NOTE — Patient Instructions (Signed)
blood tests are being requested for you today.  We'll contact you with results. normalization of testosterone is not known to harm you.  however, there are "theoretical" risks, including increased fertility, hair loss, prostate cancer, benign prostate enlargement, blood clots, liver problems, lower hdl ("good cholesterol"), sleep apnea, and behavior changes. Please return in 1 year.   

## 2014-02-12 LAB — PROLACTIN: Prolactin: 6.1 ng/mL (ref 2.1–17.1)

## 2014-02-12 LAB — TESTOSTERONE: Testosterone: 592 ng/dL (ref 300–890)

## 2014-02-12 MED ORDER — CLOMIPHENE CITRATE 50 MG PO TABS: ORAL_TABLET | ORAL | Status: DC

## 2014-02-18 ENCOUNTER — Telehealth: Payer: Self-pay | Admitting: Endocrinology

## 2014-02-18 ENCOUNTER — Telehealth: Payer: Self-pay | Admitting: Internal Medicine

## 2014-02-18 NOTE — Telephone Encounter (Signed)
Pt call back. Pt was confused why on the most recent result note it states the testosterone is slightly high and his medication was reduced. Pt states that he reviewed the numbers and based on the labs did not understand why 592 is considered slightly high. Pt stated that during office visit he had mentioned for personal reasons he was thinking the medication would be increased. Please advise, Thanks!

## 2014-02-18 NOTE — Telephone Encounter (Signed)
There is risk to increasing the testosterone level, so you should shoot for a lower level.  i want you to have the benefits of the medication, while avoiding the risks.

## 2014-02-18 NOTE — Telephone Encounter (Signed)
Patient would like to discuss his lab results with you. 

## 2014-02-18 NOTE — Telephone Encounter (Signed)
Requested call back to discuss.  

## 2014-02-18 NOTE — Telephone Encounter (Signed)
Pt states he had labs drawn on 01/25/14 and would like to discuss his results.

## 2014-02-19 NOTE — Telephone Encounter (Signed)
LMOM for pt to call office  

## 2014-02-19 NOTE — Telephone Encounter (Signed)
Requested call back from pt's mother.

## 2014-02-19 NOTE — Telephone Encounter (Signed)
Requested call back to discuss.  

## 2014-02-19 NOTE — Telephone Encounter (Signed)
Pt saw Dr. Alwyn Ren and requested to talk to his PCP about it.

## 2014-02-19 NOTE — Telephone Encounter (Signed)
Pt has been seen Dr. Yetta Barre since 2013, unsure as to who order these labs and need to address this, Thanks

## 2014-02-19 NOTE — Telephone Encounter (Signed)
Pt will need follow up appt wit PCP

## 2014-02-20 NOTE — Telephone Encounter (Signed)
Pt advised of MD note below. Pt states that for his next appointment's he would like to have blood work done prior to [employer]. Pt advised to start scheduling labs 3 to 4 days before office visit.

## 2014-02-21 ENCOUNTER — Emergency Department (HOSPITAL_COMMUNITY): Payer: BC Managed Care – PPO

## 2014-02-21 ENCOUNTER — Encounter (HOSPITAL_COMMUNITY): Payer: Self-pay | Admitting: Emergency Medicine

## 2014-02-21 ENCOUNTER — Emergency Department (HOSPITAL_COMMUNITY)
Admission: EM | Admit: 2014-02-21 | Discharge: 2014-02-21 | Disposition: A | Discharge status: Home / Self Care | Payer: BC Managed Care – PPO | Attending: Emergency Medicine | Admitting: Emergency Medicine

## 2014-02-21 ENCOUNTER — Ambulatory Visit (INDEPENDENT_AMBULATORY_CARE_PROVIDER_SITE_OTHER): Payer: BC Managed Care – PPO | Admitting: Internal Medicine

## 2014-02-21 ENCOUNTER — Encounter: Payer: Self-pay | Admitting: Internal Medicine

## 2014-02-21 VITALS — BP 130/84 | HR 85 | Temp 98.4°F | Wt >= 6400 oz

## 2014-02-21 DIAGNOSIS — E119 Type 2 diabetes mellitus without complications: Secondary | ICD-10-CM | POA: Insufficient documentation

## 2014-02-21 DIAGNOSIS — Z87448 Personal history of other diseases of urinary system: Secondary | ICD-10-CM | POA: Insufficient documentation

## 2014-02-21 DIAGNOSIS — I10 Essential (primary) hypertension: Secondary | ICD-10-CM | POA: Insufficient documentation

## 2014-02-21 DIAGNOSIS — E669 Obesity, unspecified: Secondary | ICD-10-CM | POA: Insufficient documentation

## 2014-02-21 DIAGNOSIS — Z862 Personal history of diseases of the blood and blood-forming organs and certain disorders involving the immune mechanism: Secondary | ICD-10-CM | POA: Insufficient documentation

## 2014-02-21 DIAGNOSIS — L03818 Cellulitis of other sites: Secondary | ICD-10-CM | POA: Diagnosis not present

## 2014-02-21 DIAGNOSIS — L03811 Cellulitis of head [any part, except face]: Secondary | ICD-10-CM

## 2014-02-21 DIAGNOSIS — Z7982 Long term (current) use of aspirin: Secondary | ICD-10-CM | POA: Diagnosis not present

## 2014-02-21 DIAGNOSIS — Z87891 Personal history of nicotine dependence: Secondary | ICD-10-CM | POA: Insufficient documentation

## 2014-02-21 DIAGNOSIS — Z8659 Personal history of other mental and behavioral disorders: Secondary | ICD-10-CM | POA: Diagnosis not present

## 2014-02-21 DIAGNOSIS — Z872 Personal history of diseases of the skin and subcutaneous tissue: Secondary | ICD-10-CM | POA: Diagnosis not present

## 2014-02-21 DIAGNOSIS — H60391 Other infective otitis externa, right ear: Secondary | ICD-10-CM

## 2014-02-21 DIAGNOSIS — H60399 Other infective otitis externa, unspecified ear: Secondary | ICD-10-CM | POA: Diagnosis not present

## 2014-02-21 DIAGNOSIS — L02818 Cutaneous abscess of other sites: Secondary | ICD-10-CM | POA: Diagnosis present

## 2014-02-21 DIAGNOSIS — Z880 Allergy status to penicillin: Secondary | ICD-10-CM | POA: Diagnosis not present

## 2014-02-21 DIAGNOSIS — H70009 Acute mastoiditis without complications, unspecified ear: Secondary | ICD-10-CM

## 2014-02-21 DIAGNOSIS — H70001 Acute mastoiditis without complications, right ear: Secondary | ICD-10-CM

## 2014-02-21 DIAGNOSIS — H6092 Unspecified otitis externa, left ear: Secondary | ICD-10-CM

## 2014-02-21 LAB — CBC WITH DIFFERENTIAL/PLATELET
Basophils Absolute: 0 10*3/uL (ref 0.0–0.1)
Basophils Relative: 1 % (ref 0–1)
Eosinophils Absolute: 0.2 10*3/uL (ref 0.0–0.7)
Eosinophils Relative: 2 % (ref 0–5)
HCT: 41.4 % (ref 39.0–52.0)
Hemoglobin: 14.2 g/dL (ref 13.0–17.0)
Lymphocytes Relative: 22 % (ref 12–46)
Lymphs Abs: 1.8 10*3/uL (ref 0.7–4.0)
MCH: 28.3 pg (ref 26.0–34.0)
MCHC: 34.3 g/dL (ref 30.0–36.0)
MCV: 82.6 fL (ref 78.0–100.0)
Monocytes Absolute: 0.6 10*3/uL (ref 0.1–1.0)
Monocytes Relative: 7 % (ref 3–12)
Neutro Abs: 5.8 10*3/uL (ref 1.7–7.7)
Neutrophils Relative %: 68 % (ref 43–77)
Platelets: 257 10*3/uL (ref 150–400)
RBC: 5.01 MIL/uL (ref 4.22–5.81)
RDW: 14.5 % (ref 11.5–15.5)
WBC: 8.4 10*3/uL (ref 4.0–10.5)

## 2014-02-21 LAB — BASIC METABOLIC PANEL
Anion gap: 14 (ref 5–15)
BUN: 17 mg/dL (ref 6–23)
CO2: 23 mEq/L (ref 19–32)
Calcium: 9.1 mg/dL (ref 8.4–10.5)
Chloride: 103 mEq/L (ref 96–112)
Creatinine, Ser: 0.91 mg/dL (ref 0.50–1.35)
GFR calc Af Amer: >90 mL/min (ref >90)
GFR calc non Af Amer: >90 mL/min (ref >90)
Glucose, Bld: 119 mg/dL — ABNORMAL HIGH (ref 70–99)
Potassium: 4.5 mEq/L (ref 3.7–5.3)
Sodium: 140 mEq/L (ref 137–147)

## 2014-02-21 MED ORDER — NEOMYCIN-POLYMYXIN-HC 3.5-10000-1 OT SUSP: 4.0000 [drp] | Freq: Four times a day (QID) | OTIC | Status: DC

## 2014-02-21 MED ORDER — SODIUM CHLORIDE 0.9 % IV SOLN
INTRAVENOUS | Status: DC
  Administered 2014-02-21: 13:00:00 via INTRAVENOUS

## 2014-02-21 MED ORDER — IOHEXOL 300 MG/ML  SOLN
80.0000 mL | Freq: Once | INTRAMUSCULAR | Status: AC | PRN
  Administered 2014-02-21: 80 mL via INTRAVENOUS

## 2014-02-21 MED ORDER — CLINDAMYCIN HCL 300 MG PO CAPS: 300.0000 mg | ORAL_CAPSULE | Freq: Four times a day (QID) | ORAL | Status: DC

## 2014-02-21 MED ORDER — SODIUM CHLORIDE 0.9 % IV BOLUS (SEPSIS)
500.0000 mL | Freq: Once | INTRAVENOUS | Status: AC
  Administered 2014-02-21: 500 mL via INTRAVENOUS

## 2014-02-21 NOTE — Patient Instructions (Signed)
Go to ER for stat imaging & probable admission.

## 2014-02-21 NOTE — Progress Notes (Signed)
Pre visit review using our clinic review tool, if applicable. No additional management support is needed unless otherwise documented below in the visit note. 

## 2014-02-21 NOTE — Discharge Instructions (Signed)
Use heat on the sore area of your scalp 3-4 times a day for 30 minutes.    Cellulitis Cellulitis is an infection of the skin and the tissue beneath it. The infected area is usually red and tender. Cellulitis occurs most often in the arms and lower legs.  CAUSES  Cellulitis is caused by bacteria that enter the skin through cracks or cuts in the skin. The most common types of bacteria that cause cellulitis are staphylococci and streptococci. SIGNS AND SYMPTOMS   Redness and warmth.  Swelling.  Tenderness or pain.  Fever. DIAGNOSIS  Your health care provider can usually determine what is wrong based on a physical exam. Blood tests may also be done. TREATMENT  Treatment usually involves taking an antibiotic medicine. HOME CARE INSTRUCTIONS   Take your antibiotic medicine as directed by your health care provider. Finish the antibiotic even if you start to feel better.  Keep the infected arm or leg elevated to reduce swelling.  Apply a warm cloth to the affected area up to 4 times per day to relieve pain.  Take medicines only as directed by your health care provider.  Keep all follow-up visits as directed by your health care provider. SEEK MEDICAL CARE IF:   You notice red streaks coming from the infected area.  Your red area gets larger or turns dark in color.  Your bone or joint underneath the infected area becomes painful after the skin has healed.  Your infection returns in the same area or another area.  You notice a swollen bump in the infected area.  You develop new symptoms.  You have a fever. SEEK IMMEDIATE MEDICAL CARE IF:   You feel very sleepy.  You develop vomiting or diarrhea.  You have a general ill feeling (malaise) with muscle aches and pains. MAKE SURE YOU:   Understand these instructions.  Will watch your condition.  Will get help right away if you are not doing well or get worse. Document Released: 03/17/2005 Document Revised: 10/22/2013  Document Reviewed: 08/23/2011 [facility] Patient Information 2015 [place], [place]. This information is not intended to replace advice given to you by your health care provider. Make sure you discuss any questions you have with your health care provider.  Ear Drops You need to put eardrops in your ear. HOME CARE   Put drops in your affected ear as told.  After putting in the drops, lie down with the ear you put the drops in facing up. Stay this way for 10 minutes. Use the ear drops as long as your doctor tells you.  Before you get up, put a cotton ball gently in your ear. Do not push it far in your ear.  Do not wash out your ears unless your doctor says it is okay.  Finish all medicines as told by your doctor. You may be told to keep using the eardrops even if you start to feel better.  See your doctor as told for follow-up visits. GET HELP IF:  You have pain that gets worse.  Any unusual fluid (drainage) is coming from your ear (especially if the fluid stinks).  You have trouble hearing.  You get really dizzy as if the room is spinning and feel sick to your stomach (vertigo).  The outside of your ear becomes red or puffy or both. This may be a sign of an allergic reaction. MAKE SURE YOU:   Understand these instructions.  Will watch your condition.  Will get help right away if  you are not doing well or get worse. Document Released: 11/25/2009 Document Revised: 06/12/2013 Document Reviewed: 01/02/2013 [facility] Patient Information 2015 [place], [place]. This information is not intended to replace advice given to you by your health care provider. Make sure you discuss any questions you have with your health care provider.  Otitis Externa Otitis externa is a bacterial or fungal infection of the outer ear canal. This is the area from the eardrum to the outside of the ear. Otitis externa is sometimes called "swimmer's ear." CAUSES  Possible causes of infection include:  Swimming in  dirty water.  Moisture remaining in the ear after swimming or bathing.  Mild injury (trauma) to the ear.  Objects stuck in the ear (foreign body).  Cuts or scrapes (abrasions) on the outside of the ear. SIGNS AND SYMPTOMS  The first symptom of infection is often itching in the ear canal. Later signs and symptoms may include swelling and redness of the ear canal, ear pain, and yellowish-white fluid (pus) coming from the ear. The ear pain may be worse when pulling on the earlobe. DIAGNOSIS  Your health care provider will perform a physical exam. A sample of fluid may be taken from the ear and examined for bacteria or fungi. TREATMENT  Antibiotic ear drops are often given for 10 to 14 days. Treatment may also include pain medicine or corticosteroids to reduce itching and swelling. HOME CARE INSTRUCTIONS   Apply antibiotic ear drops to the ear canal as prescribed by your health care provider.  Take medicines only as directed by your health care provider.  If you have diabetes, follow any additional treatment instructions from your health care provider.  Keep all follow-up visits as directed by your health care provider. PREVENTION   Keep your ear dry. Use the corner of a towel to absorb water out of the ear canal after swimming or bathing.  Avoid scratching or putting objects inside your ear. This can damage the ear canal or remove the protective wax that lines the canal. This makes it easier for bacteria and fungi to grow.  Avoid swimming in lakes, polluted water, or poorly chlorinated pools.  You may use ear drops made of rubbing alcohol and vinegar after swimming. Combine equal parts of white vinegar and alcohol in a bottle. Put 3 or 4 drops into each ear after swimming. SEEK MEDICAL CARE IF:   You have a fever.  Your ear is still red, swollen, painful, or draining pus after 3 days.  Your redness, swelling, or pain gets worse.  You have a severe headache.  You have redness,  swelling, pain, or tenderness in the area behind your ear. MAKE SURE YOU:   Understand these instructions.  Will watch your condition.  Will get help right away if you are not doing well or get worse. Document Released: 06/07/2005 Document Revised: 10/22/2013 Document Reviewed: 06/24/2011 [facility] Patient Information 2015 [place], [place]. This information is not intended to replace advice given to you by your health care provider. Make sure you discuss any questions you have with your health care provider.

## 2014-02-21 NOTE — ED Provider Notes (Addendum)
CSN: [id number]     Arrival date & time 02/21/14  1011 History   First MD Initiated Contact with Patient 02/21/14 1331     Chief Complaint  Patient presents with  . Cellulitis     (Consider location/radiation/quality/duration/timing/severity/associated sxs/prior Treatment) HPI  Scott Franklin is a 38 y.o. male who presents for evaluation of pain and swelling in the right posterior ear region. He feels like it is swollen. He denies fever, chills, nausea, vomiting, sinus congestion, weakness, or dizziness. He saw his primary care doctor today, who felt like he might have mastoiditis, so sent him here to be evaluated. He has not previously had this. He denies problems with hearing from either ear. He does not have a sore throat or headache. He's taking his usual medications as prescribed. There are no other known modifying factors.   Past Medical History  Diagnosis Date  . Panhypopituitarism   . Other testicular hypofunction   . ED (erectile dysfunction)   . Cellulitis   . Testosterone deficiency   . Sebaceous cyst   . Obesity   . Anemia   . Depression   . Diabetes mellitus without complication   . Hypertension    History reviewed. No pertinent past surgical history. Family History  Problem Relation Age of Onset  . Hypertension Father   . Heart attack Father   . Hypertrophic cardiomyopathy Father   . Diabetes Neg Hx    History  Substance Use Topics  . Smoking status: Former Smoker -- 0.50 packs/day for 3 years    Types: Cigarettes    Quit date: 06/21/1997  . Smokeless tobacco: Never Used  . Alcohol Use: No    Review of Systems  All other systems reviewed and are negative.     Allergies  Amoxicillin  Home Medications   Prior to Admission medications   Medication Sig Start Date End Date Taking? Authorizing Provider  aspirin 325 MG tablet Take 325 mg by mouth daily as needed for mild pain, moderate pain or headache.   Yes Historical Provider, MD  clomiPHENE  (CLOMID) 50 MG tablet Take 12.5 mg by mouth 3 (three) times a week. Monday, Wednesday, and Friday   Yes Historical Provider, MD  clindamycin (CLEOCIN) 300 MG capsule Take 1 capsule (300 mg total) by mouth 4 (four) times daily. X 7 days 02/21/14   Flint Melter, MD  neomycin-polymyxin-hydrocortisone (CORTISPORIN) 3.5-10000-1 otic suspension Place 4 drops into the left ear 4 (four) times daily. X 7 days 02/21/14   Flint Melter, MD   BP 120/78  Pulse 80  Temp(Src) 98.1 F (36.7 C) (Oral)  Resp 22  Ht  (1.753 m)  Wt 495 lb (224.531 kg)  BMI 73.07 kg/m2  SpO2 95% Physical Exam  Nursing note and vitals reviewed. Constitutional: He is oriented to person, place, and time. He appears well-developed. No distress.  Obese  HENT:  Head: Normocephalic and atraumatic.  Right Ear: External ear normal.  Left Ear: External ear normal.  There is tenderness behind the right ear, in an area that is symmetrically swollen, as compared to the left side. There is no overlying erythema, in this area. There is mild induration in the area just above, the area described, of the right side. No redness here either.There is a 2x3 cm flat skin lesion, nonspecific in an area that the pt states is chronically inflamed from his CPAP mask. This could be considered a portal for infection for the tender areas in the right  posterior ear area.  Eyes: Conjunctivae and EOM are normal. Pupils are equal, round, and reactive to light.  Neck: Normal range of motion and phonation normal. Neck supple.  Cardiovascular: Normal rate, regular rhythm, normal heart sounds and intact distal pulses.   Pulmonary/Chest: Effort normal and breath sounds normal. He exhibits no bony tenderness.  Abdominal: Soft. There is no tenderness.  Musculoskeletal: Normal range of motion.  Neurological: He is alert and oriented to person, place, and time. No cranial nerve deficit or sensory deficit. He exhibits normal muscle tone. Coordination normal.   Skin: Skin is warm, dry and intact.  Psychiatric: He has a normal mood and affect. His behavior is normal. Judgment and thought content normal.    ED Course  Procedures (including critical care time) Medications  0.9 %  sodium chloride infusion ( Intravenous New Bag/Given 02/21/14 1232)  sodium chloride 0.9 % bolus 500 mL (0 mLs Intravenous Stopped 02/21/14 1232)  iohexol (OMNIPAQUE) 300 MG/ML solution 80 mL (80 mLs Intravenous Contrast Given 02/21/14 1311)    Patient Vitals for the past 24 hrs:  BP Temp Temp src Pulse Resp SpO2 Height Weight  02/21/14 1415 120/78 mmHg - - 80 22 95 % - -  02/21/14 1400 120/69 mmHg - - 77 22 97 % - -  02/21/14 1300 125/76 mmHg - - 89 10 94 % - -  02/21/14 1245 116/71 mmHg - - 84 17 96 % - -  02/21/14 1230 126/81 mmHg - - 83 15 95 % - -  02/21/14 1215 146/87 mmHg - - 77 21 97 % - -  02/21/14 1200 140/68 mmHg - - 84 18 96 % - -  02/21/14 1034 149/87 mmHg 98.1 F (36.7 C) Oral 88 24 92 %  (1.753 m) 495 lb (224.531 kg)    4:11 PM Reevaluation with update and discussion. After initial assessment and treatment, an updated evaluation reveals he is comfortable, no further c/o. Findings discussed with pt and mother, all questions answered.Flint Melter    Labs Review Labs Reviewed  BASIC METABOLIC PANEL - Abnormal; Notable for the following:    Glucose, Bld 119 (*)    All other components within normal limits  CBC WITH DIFFERENTIAL    Imaging Review Ct Temporal Bones W/cm  02/21/2014   CLINICAL DATA:  Right post auricular cellulitis.  EXAM: CT TEMPORAL BONES WITH CONTRAST  TECHNIQUE: Axial and coronal plane CT imaging of the petrous temporal bones was performed with thin-collimation image reconstruction after intravenous contrast administration. Multiplanar CT image reconstructions were also generated.  CONTRAST:  80mL OMNIPAQUE IOHEXOL 300 MG/ML  SOLN  COMPARISON:  None.  FINDINGS: Subcutaneous inflammatory changes are present posterior to the right  ear. There is no discrete abscess. Subcutaneous lymph nodes are present on the right as well, measuring up to 9 mm.  The right external auditory canal is clear. The tympanic membrane is visualized and intact. The middle ear ossicles are normally formed and articulating. Middle ear cavity and and mastoid air cells are clear. The inner ear structures are normally formed. Superior semicircular canal is covered. The internal auditory canal and vestibular aqueduct are normal.  The lateral aspect of the left external auditory canal is clear. There is soft tissue thickening along the medial left external auditory canal abutting the tympanic membrane. There is no osseous destruction. The left middle ear ossicles are normally formed and articulating. The left middle ear cavity and mastoid air cells are clear. The inner ear structures are  normally formed. The superior semicircular canal is covered. The left internal auditory canal and vestibular aqueduct are normal.  Limited imaging of the brain is unremarkable.  IMPRESSION: 1. Subcutaneous inflammatory changes posterior to the right ear are compatible cellulitis. 2. Subcutaneous lymph nodes are likely reactive. 3. The right mastoid air cells and middle ear are clear. 4. Soft tissue thickening along the medial aspect of the left external auditory canal abutting the tympanic membrane. This is likely related to chronic inflammation. Keratosis obturans is also considered.   Electronically Signed   By: Gennette Pac M.D.   On: 02/21/2014 14:04     EKG Interpretation None      MDM   Final diagnoses:  Cellulitis of scalp  Otitis externa, left    Specific cellulitis, right scalp with out associated abscess. Un- related left ear otitis externa. There is no evidence for sinusitis or mastoiditis. Doubt sepsis, instability, or serious bacterial infection.   Nursing Notes Reviewed/ Care Coordinated Applicable Imaging Reviewed Interpretation of Laboratory Data  incorporated into ED treatment  The patient appears reasonably screened and/or stabilized for discharge and I doubt any other medical condition or other [facility] requiring further screening, evaluation, or treatment in the ED at this time prior to discharge.  Plan: Home Medications- Clindamycin, Cortisporin Otic; Home Treatments- REst, heat; return here if the recommended treatment, does not improve the symptoms; Recommended follow up- PCP 3 days, ENT 3 weeks    Flint Melter, MD 02/21/14 1624  Flint Melter, MD 02/21/14 2023

## 2014-02-21 NOTE — Progress Notes (Signed)
   Subjective:    Patient ID: Scott Franklin, male    DOB: [birth year], 38 y.o.   MRN: [id number]  HPI  He describes swelling above and behind the right ear since 02/18/14 in the evening. This spread to involve the temporal and mastoid area associated with swelling and tenderness.  He has irritation in the right ear and has been using a topical product "Exoderm".  He denies tinnitus or hearing loss. He's had no new visual symptoms.  There is also minor dizziness.  He describes a diffuse headache which did respond to aspirin.  He has a past history of skin irritation behind the ears; this was treated by Dr. Danella Deis. 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  He also has a history of orbital cellulitis  There is no history of MRSA.    Review of Systems  Frontal headache, facial pain , nasal purulence, dental pain, sore throat , otic pain or otic discharge denied. No fever , chills or sweats.Extrinsic symptoms of itchy, watery eyes, sneezing, or angioedema are denied. There is no significant cough, sputum production, wheezing,or  paroxysmal nocturnal dyspnea.      Objective:   Physical Exam   Pertinent for positive findings include: There is erythema in the  R canal. The right tympanic membrane is dull & scarred. There is dry eczematoid/keloid type formation behind the right ear. There is a tender swelling without associated erythema 6.5 x 4.5 cm over the right mastoid. This is exquisitely tender to palpation He has a 3 x 4.5 soft tissue swelling over the right superior posterior temporal area. With superior extraocular motion the left eye deviates laterally. There is slight exotropia of the right eye with superior gaze. 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  General appearance:morbidly obese but adequately nourished; no acute distress or increased work of breathing is present.  No  lymphadenopathy about the head, neck, or axilla noted.    Eyes: No conjunctival inflammation or lid edema is present. There is no scleral icterus.  Nose:  External nasal examination shows no deformity or inflammation. Nasal mucosa are pink and moist without lesions or exudates. No septal dislocation or deviation.No obstruction to airflow.   Oral exam: Dental hygiene is good; lips and gums are healthy appearing.There is no oropharyngeal erythema or exudate noted.   Neck:  No deformities, thyromegaly, masses, or tenderness noted.   Supple with full range of motion without pain.   Heart:  Normal rate and regular rhythm. S1 and S2 normal without gallop, murmur, click, rub or other extra sounds.   Abdomen massive w/o tenderness or masses.  Lungs:Chest clear to auscultation; no wheezes, rhonchi,rales ,or rubs present.No increased work of breathing.    Extremities:  No cyanosis or clubbing  noted . Trace sock line edema         Assessment & Plan:  #1 acute mastoiditis associated with subcutaneous edema of the right temple area  #2 otitis externa  #3 past history of orbital cellulitis  Plan: He will need stat CT imaging and more likely than not admission for parenteral antibiotic therapy such as levofloxacin. This recommendation is based on Up to Date references.

## 2014-02-21 NOTE — ED Notes (Signed)
Pt sent here for eval for mastoiditis and possible admission; pt sts started 3 days that is getting progressively worse

## 2014-02-21 NOTE — ED Notes (Signed)
Dr. Wentz at bedside. 

## 2014-03-26 ENCOUNTER — Other Ambulatory Visit (INDEPENDENT_AMBULATORY_CARE_PROVIDER_SITE_OTHER): Payer: BC Managed Care – PPO

## 2014-03-26 ENCOUNTER — Other Ambulatory Visit: Payer: Self-pay | Admitting: Endocrinology

## 2014-03-26 DIAGNOSIS — E291 Testicular hypofunction: Secondary | ICD-10-CM

## 2014-03-26 LAB — TESTOSTERONE: Testosterone: 292.79 ng/dL — ABNORMAL LOW (ref 300.00–890.00)

## 2014-03-27 ENCOUNTER — Other Ambulatory Visit: Payer: Self-pay | Admitting: Endocrinology

## 2014-03-27 ENCOUNTER — Telehealth: Payer: Self-pay | Admitting: Endocrinology

## 2014-03-27 MED ORDER — CLOMIPHENE CITRATE 50 MG PO TABS: 12.5000 mg | ORAL_TABLET | ORAL | Status: DC

## 2014-03-27 NOTE — Telephone Encounter (Signed)
Scott Franklin from [facility] needs clarification for the clomid # [phone]

## 2014-03-27 NOTE — Telephone Encounter (Signed)
Called pharmacy and advised that pt is to take Clomid 3 times a week on Monday, Wednesday and Friday.

## 2014-03-29 ENCOUNTER — Encounter: Payer: Self-pay | Admitting: Endocrinology

## 2014-03-29 ENCOUNTER — Ambulatory Visit (INDEPENDENT_AMBULATORY_CARE_PROVIDER_SITE_OTHER): Payer: BC Managed Care – PPO | Admitting: Endocrinology

## 2014-03-29 VITALS — BP 122/70 | HR 92 | Temp 98.2°F | Ht 69.0 in | Wt >= 6400 oz

## 2014-03-29 DIAGNOSIS — E291 Testicular hypofunction: Secondary | ICD-10-CM

## 2014-03-29 NOTE — Progress Notes (Signed)
   Subjective:    Patient ID: Scott Franklin, male    DOB: 04/03/1976, 38 y.o.   MRN: [id number]  HPI Pt returns for f/u of idiopathic central hypogonadism (dx'ed 2008; he was rx'ed with clomid; he was was unable to do MRI and US due to his weight).  He has fatigue and weight gain, which he attributes to the decreased clomid rx.   Past Medical History  Diagnosis Date  . Panhypopituitarism   . Other testicular hypofunction   . ED (erectile dysfunction)   . Cellulitis   . Testosterone deficiency   . Sebaceous cyst   . Obesity   . Anemia   . Depression   . Diabetes mellitus without complication   . Hypertension     No past surgical history on file.  History   Social History  . Marital Status: Single    Spouse Name: N/A    Number of Children: N/A  . Years of Education: N/A   Occupational History  . student    Social History Main Topics  . Smoking status: Former Smoker -- 0.50 packs/day for 3 years    Types: Cigarettes    Quit date: 06/21/1997  . Smokeless tobacco: Never Used  . Alcohol Use: No  . Drug Use: No  . Sexual Activity: Not Currently   Other Topics Concern  . Not on file   Social History Narrative  . No narrative on file    Current Outpatient Prescriptions on File Prior to Visit  Medication Sig Dispense Refill  . aspirin 325 MG tablet Take 325 mg by mouth daily as needed for mild pain, moderate pain or headache.      . clomiPHENE (CLOMID) 50 MG tablet Take 0.5 tablets (25 mg total) by mouth 4 (four) times a week. Monday, Wednesday, and Friday  10 tablet  8   No current facility-administered medications on file prior to visit.    Allergies  Allergen Reactions  . Amoxicillin Anaphylaxis and Swelling    throat swells    Family History  Problem Relation Age of Onset  . Hypertension Father   . Heart attack Father   . Hypertrophic cardiomyopathy Father   . Diabetes Neg Hx     BP 122/70  Pulse 92  Temp(Src) 98.2 F (36.8 C) (Oral)  Ht 5\' 9"   (1.753 m)  Wt 503 lb (228.159 kg)  BMI 74.25 kg/m2  SpO2 93%    Review of Systems No change in edema.      Objective:   Physical Exam VITAL SIGNS:  See vs page GENERAL: no distress.  morbid obesity.   Ext: 1+ bilat leg edema.    Lab Results  Component Value Date   TESTOSTERONE 292.79* 03/26/2014       Assessment & Plan:  Hypogonadism: he needs increased rx Weight gain, not related to hypogonadism.  Patient is advised the following: Patient Instructions  Please increase the clomiphene to 1/4 tab, 4 times a week.  Please reconsider the weight-loss surgery.  Let me decide to go ahead with it.   Also, please recheck the blood test in 1 month.

## 2014-03-29 NOTE — Patient Instructions (Signed)
Please increase the clomiphene to 1/4 tab, 4 times a week.  Please reconsider the weight-loss surgery.  Let me decide to go ahead with it.   Also, please recheck the blood test in 1 month.

## 2014-04-29 ENCOUNTER — Other Ambulatory Visit: Payer: BC Managed Care – PPO

## 2014-04-29 DIAGNOSIS — E291 Testicular hypofunction: Secondary | ICD-10-CM

## 2014-04-30 LAB — TESTOSTERONE, FREE, TOTAL, SHBG
Sex Hormone Binding: 27 nmol/L (ref 13–71)
Testosterone, Free: 93.2 pg/mL (ref 47.0–244.0)
Testosterone-% Free: 2.3 % (ref 1.6–2.9)
Testosterone: 409 ng/dL (ref 300–890)

## 2014-07-19 ENCOUNTER — Telehealth: Payer: Self-pay | Admitting: Internal Medicine

## 2014-07-19 NOTE — Telephone Encounter (Signed)
[facility] - Client TELEPHONE ADVICE RECORD   [facility]     Patient Name: Scott Franklin Initial Comment Caller states they said they didn't have any appts. He punched a granite counter top. he doesn't think anything is broken. (he punched his hand 30min He has a hematoma in between his ring finger and knuckles. He gives his mother to speak to us regarding the matter. Her name is Scott Franklin.   DOB: 1976-03-28      Nurse Assessment  Nurse: Harlon FlorWhitaker, RN, Darl PikesSusan Date/Time (Eastern Time): 07/19/2014 5:03:15 PM  Confirm and document reason for call. If symptomatic, describe symptoms. ---Caller states they said they didn't have any appts. He punched a granite counter top. he doesn't think anything is broken. (he punched his RT hand 30min He has a hematoma in between his ring finger and knuckles. He gives his mother to speak to us regarding the matter. Her name is Scott Franklin. He burned his other hand.,  Has the patient traveled out of the country within the last 30 days? ---No  Does the patient require triage? ---Yes  Related visit to physician within the last 2 weeks? ---No  Does the PT have any chronic conditions? (i.e. diabetes, asthma, etc.) ---Yes  List chronic conditions. ---anxiety low testosterone    Guidelines     Guideline Title Affirmed Question Affirmed Notes   Hand and Wrist Injury [1] Large swelling or bruise (> 2 inches or 5 cm) AND [2] can't use injured hand normally (e.g., make a fist, open fully, hold a glass of water)    Final Disposition User   See Physician within 4 Hours (or PCP triage) Harlon FlorWhitaker, RN, Darl PikesSusan

## 2014-07-22 ENCOUNTER — Telehealth: Payer: Self-pay | Admitting: *Deleted

## 2014-07-22 NOTE — Telephone Encounter (Signed)
[facility] - Client TELEPHONE ADVICE RECORD [facility] Patient Name: Scott MuslimMICHAEL Joost Gender: Male DOB: 01-04-1976 Age: 39 Y 5 M 13 D Return Phone Number: [phone] (Primary), [phone] (Secondary) Address: [address] [place]State[place]Zip: [place] [address] Client [facility] - Client Client Site [facility] Day Physician Sanda LingerJones, Thomas Contact Type Call Call Type Triage / Clinical Relationship To Patient Self Appointment Disposition EMR Appointment Not Necessary Return Phone Number [phone] (Primary) Chief Complaint Hand or Wrist Injury Initial Comment Caller states they said they didn't have any appts. He punched a granite counter top. he doesn't think anything is broken. (he punched his hand 30min He has a hematoma in between his ring finger and knuckles. He gives his mother to speak to Scott Franklin regarding the matter. Her name is Scott FortesMellisa Salak. GOTO Facility Not Listed See MD within 4 hrs NO open appts at PCP PreDisposition Go to Urgent Care/Walk-In Clinic Info pasted into Epic Yes Nurse Assessment Nurse: Harlon FlorWhitaker, RN, Darl PikesSusan Date/Time (Eastern Time): 07/19/2014 5:03:15 PM Confirm and document reason for call. If symptomatic, describe symptoms. ---Caller states they said they didn't have any appts. He punched a granite counter top. he doesn't think anything is broken. (he punched his RT hand 30min He has a hematoma in between his ring finger and knuckles. He gives his mother to speak to Scott Franklin regarding the matter. Her name is Scott FortesMellisa Franklin. He burned his other hand., Has the patient traveled out of the country within the last 30 days? ---No Does the patient require triage? ---Yes Related visit to physician within the last 2 weeks? ---No Does the PT have any chronic conditions? (i.e. diabetes, asthma, etc.) ---Yes List chronic conditions. ---anxiety low testosterone Guidelines Guideline  Title Affirmed Question Affirmed Notes Nurse Date/Time (Eastern Time) Hand and Wrist Injury [1] Large swelling or bruise (> 2 inches or 5 cm) AND [2] can't use injured hand normally (e.g., make a fist, open Harlon FlorWhitaker, RN, Darl PikesSusan 07/19/2014 5:05:31 PM PLEASE NOTE: All timestamps contained within this report are represented as [place] Standard Time. CONFIDENTIALTY NOTICE: This fax transmission is intended only for the addressee. It contains information that is legally privileged, confidential or otherwise protected from use or disclosure. If you are not the intended recipient, you are strictly prohibited from reviewing, disclosing, copying using or disseminating any of this information or taking any action in reliance on or regarding this information. If you have received this fax in error, please notify Scott Franklin immediately by telephone so that we can arrange for its return to Scott Franklin. Phone: [phone], Toll-Free: [phone], Fax: [phone] Page: 2 of 2 Call Id: [id number] Guidelines Guideline Title Affirmed Question Affirmed Notes Nurse Date/Time Lamount Cohen(Eastern Time) fully, hold a glass of water) Disp. Time Lamount Cohen(Eastern Time) Disposition Final User 07/19/2014 5:06:13 PM See Physician within 4 Hours (or PCP triage) Yes Harlon FlorWhitaker, RN, Helane RimaSusan Caller Understands: Yes Disagree/Comply: Comply Care Advice Given Per Guideline SEE PHYSICIAN WITHIN 4 HOURS (or PCP triage): LOCAL COLD: Apply cold pack or an ice bag (wrapped

## 2014-11-13 ENCOUNTER — Telehealth: Payer: Self-pay | Admitting: *Deleted

## 2014-11-13 NOTE — Telephone Encounter (Signed)
Attempted to contact patient via telephone several times.  Awaiting call back.

## 2014-11-13 NOTE — Telephone Encounter (Signed)
-----   Message from Scott Milchakesh Alva Franklin, Scott Franklin sent at 11/08/2014  5:27 PM EDT ----- Scott Franklin, Pl let him know that if he wants us to renew prescriptions for his supplies then , he will have to be seen 'face to face ' visit at least once/ year. Alternatively, he can get his supplies from the internet or ask his PCP to provide.  RA  ----- Message -----    From: Scott Franklin    Sent: 11/08/2014   2:51 PM      To: Scott Milchakesh Alva Franklin, Scott Franklin  I just called Scott Franklin about needing an office visit per your note on the renewal order for new CPAP supplies.  He stated he didn't need to come back to be seen unless he was having issues per your office visit in 02/18/2012. He stated you told him this.  When [name] saw him 11/02/2013 she stated to follow up with you in 1 year.  He didn't like that response I was giving him.  He told me to find out exactly why he needed to be seen again.  I explained I was reading the last office note and per that note he should be seen.  I also told him that if he didn't need and office visit that was his choice.  He also stated that you could sign him up for lifetime supplies or something like that.  Would you be willing to call "HIM" about this follow-up for supplies. He gave me Attitude just between you and me.   Thanks Lemont FurnaceAnita, [facility]

## 2014-11-14 NOTE — Telephone Encounter (Signed)
lmomtcb for pt 

## 2014-11-21 NOTE — Telephone Encounter (Signed)
Patient is upset and says that an appointment "is not going to happen", he says that Dr. Vassie LollAlva told him that he did not have to come back in to get supplies as long as he did not have any problems with the machine.  He is demanding a call directly from Dr. Vassie LollAlva.  Patient was upset that he did not get a call back sooner, although, I advised patient that I had attempted to contact him several times since the 20th.  He then said that he was unavailable during that time because he had a lot of company in town for the holidays.   He made several comments about expense of having to come in for OV and made several inappropriate comments regarding inability to obtain CPAP supplies.    To Dr. Vassie LollAlva

## 2014-12-16 NOTE — Telephone Encounter (Signed)
[facility].  See message from 11/21/2014... Dr. Vassie LollAlva has been advised.  Sent message to Efraim KaufmannMelissa advising her of same. Nothing further needed.

## 2015-05-05 ENCOUNTER — Other Ambulatory Visit: Payer: Self-pay

## 2015-05-05 MED ORDER — CLOMIPHENE CITRATE 50 MG PO TABS: 12.5000 mg | ORAL_TABLET | ORAL | Status: DC

## 2015-05-06 ENCOUNTER — Other Ambulatory Visit: Payer: Self-pay

## 2015-05-06 MED ORDER — CLOMIPHENE CITRATE 50 MG PO TABS: 12.5000 mg | ORAL_TABLET | ORAL | Status: DC

## 2015-05-29 ENCOUNTER — Ambulatory Visit: Payer: Self-pay | Admitting: Internal Medicine

## 2015-06-20 ENCOUNTER — Other Ambulatory Visit: Payer: Self-pay | Admitting: Endocrinology

## 2015-07-10 ENCOUNTER — Telehealth: Payer: Self-pay | Admitting: Endocrinology

## 2015-07-11 NOTE — Telephone Encounter (Signed)
error 

## 2015-07-14 ENCOUNTER — Other Ambulatory Visit (INDEPENDENT_AMBULATORY_CARE_PROVIDER_SITE_OTHER): Payer: Managed Care, Other (non HMO)

## 2015-07-14 ENCOUNTER — Telehealth: Payer: Self-pay

## 2015-07-14 DIAGNOSIS — E291 Testicular hypofunction: Secondary | ICD-10-CM | POA: Diagnosis not present

## 2015-07-14 LAB — CBC WITH DIFFERENTIAL/PLATELET
Basophils Absolute: 0.1 10*3/uL (ref 0.0–0.1)
Basophils Relative: 0.7 % (ref 0.0–3.0)
Eosinophils Absolute: 0.2 10*3/uL (ref 0.0–0.7)
Eosinophils Relative: 2.4 % (ref 0.0–5.0)
HCT: 42.8 % (ref 39.0–52.0)
Hemoglobin: 14.3 g/dL (ref 13.0–17.0)
Lymphocytes Relative: 23.6 % (ref 12.0–46.0)
Lymphs Abs: 2.1 10*3/uL (ref 0.7–4.0)
MCHC: 33.4 g/dL (ref 30.0–36.0)
MCV: 85.5 fl (ref 78.0–100.0)
Monocytes Absolute: 0.5 10*3/uL (ref 0.1–1.0)
Monocytes Relative: 5.6 % (ref 3.0–12.0)
Neutro Abs: 6.2 10*3/uL (ref 1.4–7.7)
Neutrophils Relative %: 67.7 % (ref 43.0–77.0)
Platelets: 280 10*3/uL (ref 150.0–400.0)
RBC: 5.01 Mil/uL (ref 4.22–5.81)
RDW: 14.7 % (ref 11.5–15.5)
WBC: 9.1 10*3/uL (ref 4.0–10.5)

## 2015-07-14 LAB — TSH: TSH: 3.63 u[IU]/mL (ref 0.35–4.50)

## 2015-07-14 NOTE — Telephone Encounter (Signed)
Pt is scheduled to come in for labs today at 10:45 am. Please advise what labs should be ordered. Thanks!

## 2015-07-14 NOTE — Telephone Encounter (Signed)
i have ordered 

## 2015-07-18 ENCOUNTER — Ambulatory Visit (INDEPENDENT_AMBULATORY_CARE_PROVIDER_SITE_OTHER): Payer: BLUE CROSS/BLUE SHIELD | Admitting: Endocrinology

## 2015-07-18 ENCOUNTER — Encounter: Payer: Self-pay | Admitting: Endocrinology

## 2015-07-18 VITALS — BP 133/78 | HR 105 | Temp 98.2°F | Ht 69.0 in | Wt >= 6400 oz

## 2015-07-18 DIAGNOSIS — E291 Testicular hypofunction: Secondary | ICD-10-CM

## 2015-07-18 NOTE — Progress Notes (Signed)
   Subjective:    Patient ID: Scott Franklin, male    DOB: September 23, 1975, 40 y.o.   MRN: [id number]  HPI Pt returns for f/u of idiopathic central hypogonadism (dx'ed 2008; he was rx'ed with clomid; he was was unable to do MRI, due to his weight; he is not considering fertility now).  He ran out of clomid approx 1 month ago.  Until then, he was taking 1/2 tab TIW.  pt states he feels well in general, except for fatigue.  Past Medical History  Diagnosis Date  . Panhypopituitarism (HCC)   . Other testicular hypofunction   . ED (erectile dysfunction)   . Cellulitis   . Testosterone deficiency   . Sebaceous cyst   . Obesity   . Anemia   . Depression   . Diabetes mellitus without complication (HCC)   . Hypertension     No past surgical history on file.  Social History   Social History  . Marital Status: Single    Spouse Name: N/A  . Number of Children: N/A  . Years of Education: N/A   Occupational History  . student    Social History Main Topics  . Smoking status: Former Smoker -- 0.50 packs/day for 3 years    Types: Cigarettes    Quit date: 06/21/1997  . Smokeless tobacco: Never Used  . Alcohol Use: No  . Drug Use: No  . Sexual Activity: Not Currently   Other Topics Concern  . Not on file   Social History Narrative    Current Outpatient Prescriptions on File Prior to Visit  Medication Sig Dispense Refill  . aspirin 325 MG tablet Take 325 mg by mouth daily as needed for mild pain, moderate pain or headache.     No current facility-administered medications on file prior to visit.    Allergies  Allergen Reactions  . Amoxicillin Anaphylaxis and Swelling    throat swells    Family History  Problem Relation Age of Onset  . Hypertension Father   . Heart attack Father   . Hypertrophic cardiomyopathy Father   . Diabetes Neg Hx     BP 133/78 mmHg  Pulse 105  Temp(Src) 98.2 F (36.8 C) (Oral)  Ht  (1.753 m)  Wt 495 lb (224.531 kg)  BMI 73.07 kg/m2   SpO2 93%  Review of Systems Denies decreased urinary stream    Objective:   Physical Exam VITAL SIGNS:  See vs page GENERAL: no distress GENITALIA: Normal male testicles, scrotum, and penis.    Lab Results  Component Value Date   TESTOSTERONE 184* 07/18/2015       Assessment & Plan:  Hypogonadism: worse off clomid  Patient is advised the following: Patient Instructions  blood tests are requested for you today.  We'll let you know about the results.  Testosterone treatment has risks, including increased or decreased fertility (depending on the type of treatment), hair loss, prostate cancer, benign prostate enlargement, blood clots, liver problems, lower hdl ("good cholesterol"), polycythemia (opposite of anemia), sleep apnea, and behavior changes.    Addendum: i have sent a prescription to your pharmacy, to resume clomid

## 2015-07-18 NOTE — Patient Instructions (Addendum)
blood tests are requested for you today.  We'll let you know about the results. Testosterone treatment has risks, including increased or decreased fertility (depending on the type of treatment), hair loss, prostate cancer, benign prostate enlargement, blood clots, liver problems, lower hdl ("good cholesterol"), polycythemia (opposite of anemia), sleep apnea, and behavior changes.  

## 2015-07-19 ENCOUNTER — Emergency Department (HOSPITAL_COMMUNITY): Payer: BLUE CROSS/BLUE SHIELD

## 2015-07-19 ENCOUNTER — Encounter (HOSPITAL_COMMUNITY): Payer: Self-pay | Admitting: Emergency Medicine

## 2015-07-19 ENCOUNTER — Emergency Department (HOSPITAL_COMMUNITY)
Admission: EM | Admit: 2015-07-19 | Discharge: 2015-07-19 | Disposition: A | Discharge status: Home / Self Care | Payer: BLUE CROSS/BLUE SHIELD | Attending: Emergency Medicine | Admitting: Emergency Medicine

## 2015-07-19 DIAGNOSIS — Z8659 Personal history of other mental and behavioral disorders: Secondary | ICD-10-CM | POA: Insufficient documentation

## 2015-07-19 DIAGNOSIS — S91312A Laceration without foreign body, left foot, initial encounter: Secondary | ICD-10-CM

## 2015-07-19 DIAGNOSIS — Z872 Personal history of diseases of the skin and subcutaneous tissue: Secondary | ICD-10-CM | POA: Insufficient documentation

## 2015-07-19 DIAGNOSIS — Z87891 Personal history of nicotine dependence: Secondary | ICD-10-CM | POA: Diagnosis not present

## 2015-07-19 DIAGNOSIS — Z23 Encounter for immunization: Secondary | ICD-10-CM | POA: Diagnosis not present

## 2015-07-19 DIAGNOSIS — I10 Essential (primary) hypertension: Secondary | ICD-10-CM | POA: Insufficient documentation

## 2015-07-19 DIAGNOSIS — Z880 Allergy status to penicillin: Secondary | ICD-10-CM | POA: Insufficient documentation

## 2015-07-19 DIAGNOSIS — Z87438 Personal history of other diseases of male genital organs: Secondary | ICD-10-CM | POA: Insufficient documentation

## 2015-07-19 DIAGNOSIS — S91112A Laceration without foreign body of left great toe without damage to nail, initial encounter: Secondary | ICD-10-CM | POA: Diagnosis present

## 2015-07-19 DIAGNOSIS — E119 Type 2 diabetes mellitus without complications: Secondary | ICD-10-CM | POA: Diagnosis not present

## 2015-07-19 DIAGNOSIS — E669 Obesity, unspecified: Secondary | ICD-10-CM | POA: Insufficient documentation

## 2015-07-19 DIAGNOSIS — X58XXXA Exposure to other specified factors, initial encounter: Secondary | ICD-10-CM | POA: Insufficient documentation

## 2015-07-19 DIAGNOSIS — Y998 Other external cause status: Secondary | ICD-10-CM | POA: Diagnosis not present

## 2015-07-19 DIAGNOSIS — Z862 Personal history of diseases of the blood and blood-forming organs and certain disorders involving the immune mechanism: Secondary | ICD-10-CM | POA: Insufficient documentation

## 2015-07-19 DIAGNOSIS — Z79899 Other long term (current) drug therapy: Secondary | ICD-10-CM | POA: Diagnosis not present

## 2015-07-19 DIAGNOSIS — Z7982 Long term (current) use of aspirin: Secondary | ICD-10-CM | POA: Insufficient documentation

## 2015-07-19 DIAGNOSIS — Y9301 Activity, walking, marching and hiking: Secondary | ICD-10-CM | POA: Insufficient documentation

## 2015-07-19 DIAGNOSIS — Y92094 Garage of other non-institutional residence as the place of occurrence of the external cause: Secondary | ICD-10-CM | POA: Insufficient documentation

## 2015-07-19 LAB — TESTOSTERONE,FREE AND TOTAL
Testosterone, Free: 8.3 pg/mL — ABNORMAL LOW (ref 8.7–25.1)
Testosterone: 184 ng/dL — ABNORMAL LOW (ref 348–1197)

## 2015-07-19 MED ORDER — TETANUS-DIPHTH-ACELL PERTUSSIS 5-2.5-18.5 LF-MCG/0.5 IM SUSP
0.5000 mL | Freq: Once | INTRAMUSCULAR | Status: AC
  Administered 2015-07-19: 0.5 mL via INTRAMUSCULAR
  Filled 2015-07-19: qty 0.5

## 2015-07-19 MED ORDER — DOXYCYCLINE HYCLATE 100 MG PO CAPS: 100.0000 mg | ORAL_CAPSULE | Freq: Two times a day (BID) | ORAL | Status: DC

## 2015-07-19 MED ORDER — IBUPROFEN 400 MG PO TABS
800.0000 mg | ORAL_TABLET | Freq: Once | ORAL | Status: DC
  Filled 2015-07-19: qty 2

## 2015-07-19 MED ORDER — IBUPROFEN 800 MG PO TABS: 800.0000 mg | ORAL_TABLET | Freq: Three times a day (TID) | ORAL | Status: DC

## 2015-07-19 MED ORDER — DOXYCYCLINE HYCLATE 100 MG PO TABS
100.0000 mg | ORAL_TABLET | Freq: Once | ORAL | Status: AC
  Administered 2015-07-19: 100 mg via ORAL
  Filled 2015-07-19: qty 1

## 2015-07-19 NOTE — ED Provider Notes (Signed)
CSN: [id number]     Arrival date & time 07/19/15  1730 History  By signing my name below, I, Doreatha Martin, attest that this documentation has been prepared under the direction and in the presence of Cayman Kielbasa, PA-C. Electronically Signed: Doreatha Martin, ED Scribe. 07/19/2015. 6:26 PM.    Chief Complaint  Patient presents with  . Extremity Laceration   The history is provided by the patient. No language interpreter was used.    HPI Comments: Scott Franklin is a 40 y.o. male who presents to the Emergency Department complaining of a laceration with controlled bleeding to superior aspect of the left great toe that occurred just PTA. Pt states he was barefoot in the garage, stepped on something with his right foot in his garage, and tried to brush it off on his left foot when he was cut by the object. Pt states he believes the object was possibly bark, but did not see it. He states the wound initially bled profusely but was controlled after dressing was applied. Tdap out of date. He denies neurologic deficits, additional injuries, or any other complaints. Patient did not fall. Denies current pain.  Past Medical History  Diagnosis Date  . Panhypopituitarism (HCC)   . Other testicular hypofunction   . ED (erectile dysfunction)   . Cellulitis   . Testosterone deficiency   . Sebaceous cyst   . Obesity   . Anemia   . Depression   . Diabetes mellitus without complication (HCC)   . Hypertension    History reviewed. No pertinent past surgical history. Family History  Problem Relation Age of Onset  . Hypertension Father   . Heart attack Father   . Hypertrophic cardiomyopathy Father   . Diabetes Neg Hx    Social History  Substance Use Topics  . Smoking status: Former Smoker -- 0.50 packs/day for 3 years    Types: Cigarettes    Quit date: 06/21/1997  . Smokeless tobacco: Never Used  . Alcohol Use: No    Review of Systems  Skin: Positive for wound.  Neurological: Negative for numbness.    Allergies  Amoxicillin  Home Medications   Prior to Admission medications   Medication Sig Start Date End Date Taking? Authorizing Provider  aspirin 325 MG tablet Take 325 mg by mouth daily as needed for mild pain, moderate pain or headache.    Historical Provider, MD  clomiPHENE (CLOMID) 50 MG tablet Take 0.5 tablets (25 mg total) by mouth 3 (three) times a week. Monday, Wednesday, and Friday 05/06/15   Romero Belling, MD  doxycycline (VIBRAMYCIN) 100 MG capsule Take 1 capsule (100 mg total) by mouth 2 (two) times daily. 07/19/15   Ariah Mower C Raford Brissett, PA-C  ibuprofen (ADVIL,MOTRIN) 800 MG tablet Take 1 tablet (800 mg total) by mouth 3 (three) times daily. 07/19/15   Thayden Lemire C Sydnee Lamour, PA-C   BP 115/94 mmHg  Pulse 103  Temp(Src) 98.2 F (36.8 C) (Oral)  Resp 22  SpO2 96% Physical Exam  Constitutional: He is oriented to person, place, and time. He appears well-developed and well-nourished.  HENT:  Head: Normocephalic and atraumatic.  Cardiovascular: Normal rate and intact distal pulses.   Pulmonary/Chest: Effort normal. No respiratory distress.  Musculoskeletal: Normal range of motion.  1/4 cm laceration to the left great toe on the superior side. Bleeding is controlled. Circulation, motor and sensation intact distal to the injury. No obvious foreign bodies palpated. No injuries noted to the right foot.  Neurological: He is alert and  oriented to person, place, and time.  Skin: Skin is warm and dry.  Psychiatric: He has a normal mood and affect. His behavior is normal.  Nursing note and vitals reviewed.   ED Course  .Marland KitchenLaceration Repair Date/Time: 07/19/2015 6:54 PM Performed by: Anselm Pancoast Authorized by: Anselm Pancoast Consent: Verbal consent obtained. Risks and benefits: risks, benefits and alternatives were discussed Consent given by: patient Patient understanding: patient states understanding of the procedure being performed Patient consent: the patient's understanding of the procedure  matches consent given Procedure consent: procedure consent matches procedure scheduled Patient identity confirmed: verbally with patient and arm band Time out: Immediately prior to procedure a "time out" was called to verify the correct patient, procedure, equipment, support staff and site/side marked as required. Body area: lower extremity Location details: left big toe Laceration length: 0.3 cm Foreign bodies: no foreign bodies Tendon involvement: none Nerve involvement: none Vascular damage: no Preparation: Patient was prepped and draped in the usual sterile fashion. Irrigation solution: saline Irrigation method: syringe Amount of cleaning: extensive Debridement: none Degree of undermining: none Approximation: loose Approximation difficulty: simple Patient tolerance: Patient tolerated the procedure well with no immediate complications Comments: This patient's wound was evaluated and the decision was made to leave it open due to the possibility that it was caused by a puncture.   (including critical care time) DIAGNOSTIC STUDIES: Oxygen Saturation is 96% on RA, adequate by my interpretation.    COORDINATION OF CARE: 6:22 PM Discussed treatment plan with pt at bedside which includes XR, wound care and pt agreed to plan.   Imaging Review Dg Foot Complete Left  07/19/2015  CLINICAL DATA:  Puncture wound by foreign body at the base of first metatarsal. EXAM: LEFT FOOT - COMPLETE 3+ VIEW COMPARISON:  None. FINDINGS: Three views study shows no fracture. No subluxation or dislocation. No evidence for radiopaque soft tissue foreign body. IMPRESSION: Negative. Electronically Signed   By: Kennith Center M.D.   On: 07/19/2015 19:26   I have personally reviewed and evaluated these images as part of my medical decision-making.   MDM   Final diagnoses:  Foot laceration, left, initial encounter    Scott Franklin presents to the ED due to a laceration with controlled bleeding to the  superior aspect of the left great toe that occurred just PTA. Suture repair not indicated due to the size of the wound and the possibility of this being a puncture wound. Tetanus updated in the ED. Discussed laceration care with pt and answered questions. XR negative for radiopaque foreign body or osseus involvement. Will discharge with doxycycline. Pt is hemodynamically stable with no complaints prior to dc. Pt appears safe for discharge at this time and is agreeable to plan. Advised pt to follow up with PCP in 3 days for wound check. Return precautions discussed.   I personally performed the services described in this documentation, which was scribed in my presence. The recorded information has been reviewed and is accurate.   Anselm Pancoast, PA-C 07/19/15 1935  Gerhard Munch, MD 07/20/15 [phone]

## 2015-07-19 NOTE — ED Notes (Signed)
Pt reports walked outside of his garage and stepped on something he thought was bark, with his right foot he rub it against the left foot causing a laceration. Dried blood noted. Pt went to urgent care but was told no one there does stitches.

## 2015-07-19 NOTE — Discharge Instructions (Signed)
You have been seen today for a foot injury. Your imaging showed no abnormalities. Follow up with PCP or return to the ED in 3 days for a wound recheck. Return to ED should symptoms worsen or if signs of infection such as redness, pus, intense pain, or any other major concerns arise. Please take all of your antibiotics until finished!   You may develop abdominal discomfort or diarrhea from the antibiotic.  You may help offset this with probiotics which you can buy or get in yogurt. Do not eat or take the probiotics until 2 hours after your antibiotic.

## 2015-07-20 MED ORDER — CLOMIPHENE CITRATE 50 MG PO TABS: ORAL_TABLET | ORAL | Status: DC

## 2016-01-29 ENCOUNTER — Other Ambulatory Visit: Payer: Self-pay

## 2016-01-29 ENCOUNTER — Emergency Department (HOSPITAL_COMMUNITY): Payer: BLUE CROSS/BLUE SHIELD

## 2016-01-29 ENCOUNTER — Encounter (HOSPITAL_COMMUNITY): Payer: Self-pay

## 2016-01-29 ENCOUNTER — Telehealth: Payer: Self-pay | Admitting: Internal Medicine

## 2016-01-29 ENCOUNTER — Emergency Department (HOSPITAL_COMMUNITY)
Admission: EM | Admit: 2016-01-29 | Discharge: 2016-01-29 | Disposition: A | Discharge status: Home / Self Care | Payer: BLUE CROSS/BLUE SHIELD | Attending: Emergency Medicine | Admitting: Emergency Medicine

## 2016-01-29 DIAGNOSIS — Y999 Unspecified external cause status: Secondary | ICD-10-CM | POA: Insufficient documentation

## 2016-01-29 DIAGNOSIS — Y9301 Activity, walking, marching and hiking: Secondary | ICD-10-CM | POA: Diagnosis not present

## 2016-01-29 DIAGNOSIS — M940 Chondrocostal junction syndrome [Tietze]: Secondary | ICD-10-CM | POA: Diagnosis not present

## 2016-01-29 DIAGNOSIS — R0782 Intercostal pain: Secondary | ICD-10-CM

## 2016-01-29 DIAGNOSIS — Z7982 Long term (current) use of aspirin: Secondary | ICD-10-CM | POA: Diagnosis not present

## 2016-01-29 DIAGNOSIS — W109XXA Fall (on) (from) unspecified stairs and steps, initial encounter: Secondary | ICD-10-CM | POA: Insufficient documentation

## 2016-01-29 DIAGNOSIS — I10 Essential (primary) hypertension: Secondary | ICD-10-CM | POA: Insufficient documentation

## 2016-01-29 DIAGNOSIS — E119 Type 2 diabetes mellitus without complications: Secondary | ICD-10-CM | POA: Diagnosis not present

## 2016-01-29 DIAGNOSIS — R079 Chest pain, unspecified: Secondary | ICD-10-CM | POA: Diagnosis present

## 2016-01-29 DIAGNOSIS — Z87891 Personal history of nicotine dependence: Secondary | ICD-10-CM | POA: Diagnosis not present

## 2016-01-29 DIAGNOSIS — Y929 Unspecified place or not applicable: Secondary | ICD-10-CM | POA: Insufficient documentation

## 2016-01-29 LAB — BASIC METABOLIC PANEL
Anion gap: 10 (ref 5–15)
BUN: 13 mg/dL (ref 6–20)
CO2: 26 mmol/L (ref 22–32)
Calcium: 9 mg/dL (ref 8.9–10.3)
Chloride: 101 mmol/L (ref 101–111)
Creatinine, Ser: 0.91 mg/dL (ref 0.61–1.24)
GFR calc Af Amer: >60 mL/min (ref >60)
GFR calc non Af Amer: >60 mL/min (ref >60)
Glucose, Bld: 196 mg/dL — ABNORMAL HIGH (ref 65–99)
Potassium: 4 mmol/L (ref 3.5–5.1)
Sodium: 137 mmol/L (ref 135–145)

## 2016-01-29 LAB — CBC
HCT: 42.7 % (ref 39.0–52.0)
Hemoglobin: 14.1 g/dL (ref 13.0–17.0)
MCH: 28.7 pg (ref 26.0–34.0)
MCHC: 33 g/dL (ref 30.0–36.0)
MCV: 87 fL (ref 78.0–100.0)
Platelets: 288 10*3/uL (ref 150–400)
RBC: 4.91 MIL/uL (ref 4.22–5.81)
RDW: 13.9 % (ref 11.5–15.5)
WBC: 11.2 10*3/uL — ABNORMAL HIGH (ref 4.0–10.5)

## 2016-01-29 LAB — I-STAT TROPONIN, ED: Troponin i, poc: 0 ng/mL (ref 0.00–0.08)

## 2016-01-29 MED ORDER — NAPROXEN 250 MG PO TABS
500.0000 mg | ORAL_TABLET | Freq: Once | ORAL | Status: AC
  Administered 2016-01-29: 500 mg via ORAL
  Filled 2016-01-29: qty 2

## 2016-01-29 MED ORDER — CYCLOBENZAPRINE HCL 10 MG PO TABS
10.0000 mg | ORAL_TABLET | Freq: Once | ORAL | Status: AC
  Administered 2016-01-29: 10 mg via ORAL
  Filled 2016-01-29: qty 1

## 2016-01-29 MED ORDER — CYCLOBENZAPRINE HCL 10 MG PO TABS: 10.0000 mg | ORAL_TABLET | Freq: Two times a day (BID) | ORAL | 0 refills | Status: DC | PRN

## 2016-01-29 MED ORDER — HYDROCODONE-ACETAMINOPHEN 5-325 MG PO TABS: 1.0000 | ORAL_TABLET | Freq: Four times a day (QID) | ORAL | 0 refills | Status: DC | PRN

## 2016-01-29 MED ORDER — NAPROXEN 500 MG PO TABS: 500.0000 mg | ORAL_TABLET | Freq: Two times a day (BID) | ORAL | 0 refills | Status: DC

## 2016-01-29 NOTE — ED Triage Notes (Signed)
Patient here with left anterior chest pain since Sunday. Had a fall on Sunday onto hands and knees and the CP is continuing. Describes the pain as worse with movement and inspiration. No acute distress

## 2016-01-29 NOTE — ED Provider Notes (Signed)
[facility] Provider Note   CSN: [id number] Arrival date & time: 01/29/16  1324  First Provider Contact:  None       History   Chief Complaint Chief Complaint  Patient presents with  . Chest Pain    HPI Scott Franklin is a 40 y.o. male.  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 Patient states that 4 days ago, he tripped while walking down stairs. He landed on his hands and knees. He had mild left wrist and left knee pain that has since improved. The following day, he noticed a sharp, positional, transient left-sided chest pain. The pain is made worse with any position changes or movement of his arm. He has minimal pain when he is sitting still. Pain also worsens with inspiration. He was in his usual state of health prior to the fall with no recent leg swelling, travel, or other symptoms. He has had no cough. He states that over the last several days his pain has persisted and is now constant. Subsequent presents for evaluation. Pain is been constant for the last 24 hours. No other changes. No hemoptysis. No history of PEs.   The history is provided by the patient and medical records.  Chest Pain   Pertinent negatives include no abdominal pain, no cough, no fever, no headaches, no nausea, no shortness of breath, no vomiting and no weakness.    Past Medical History:  Diagnosis Date  . Anemia   . Cellulitis   . Depression   . Diabetes mellitus without complication (HCC)   . ED (erectile dysfunction)   . Hypertension   . Obesity   . Other testicular hypofunction   . Panhypopituitarism (HCC)   . Sebaceous cyst   . Testosterone deficiency     Patient Active Problem List   Diagnosis Date Noted  . Anemia 04/06/2012  . Routine general medical examination at a health care facility 04/06/2012  . OSA on CPAP 12/21/2010  . Hypogonadism male 04/20/2007  . Obesity,  morbid (HCC) 04/10/2007    History reviewed. No pertinent surgical history.     Home Medications    Prior to Admission medications   Medication Sig Start Date End Date Taking? Authorizing Provider  aspirin 325 MG tablet Take 325 mg by mouth daily as needed for mild pain, moderate pain or headache.    Historical Provider, MD  clomiPHENE (CLOMID) 50 MG tablet 1/4 tab daily 07/20/15   Romero Belling, MD  cyclobenzaprine (FLEXERIL) 10 MG tablet Take 1 tablet (10 mg total) by mouth 2 (two) times daily as needed for muscle spasms. 01/29/16   Shaune Pollack, MD  doxycycline (VIBRAMYCIN) 100 MG capsule Take 1 capsule (100 mg total) by mouth 2 (two) times daily. 07/19/15   Shawn C Joy, PA-C  HYDROcodone-acetaminophen (NORCO/VICODIN) 5-325 MG tablet Take 1 tablet by mouth every 6 (six) hours as needed for severe pain. 01/29/16   Shaune Pollack, MD  ibuprofen (ADVIL,MOTRIN) 800 MG tablet Take 1 tablet (800 mg total) by mouth 3 (three) times daily. 07/19/15   Shawn C Joy, PA-C  naproxen (NAPROSYN) 500 MG tablet Take 1 tablet (500 mg total) by mouth 2 (two) times daily. 01/29/16   Shaune Pollack, MD    Family History Family History  Problem Relation Age of Onset  . Hypertension Father   . Heart attack Father   . Hypertrophic cardiomyopathy Father   . Diabetes Neg Hx  Social History Social History  Substance Use Topics  . Smoking status: Former Smoker    Packs/day: 0.50    Years: 3.00    Types: Cigarettes    Quit date: 06/21/1997  . Smokeless tobacco: Never Used  . Alcohol use No     Allergies   Amoxicillin   Review of Systems Review of Systems  Constitutional: Negative for chills, fatigue and fever.  HENT: Negative for congestion and rhinorrhea.   Eyes: Negative for visual disturbance.  Respiratory: Negative for cough, shortness of breath and wheezing.   Cardiovascular: Positive for chest pain. Negative for leg swelling.  Gastrointestinal: Negative for abdominal pain, diarrhea,  nausea and vomiting.  Genitourinary: Negative for dysuria and flank pain.  Musculoskeletal: Negative for neck pain and neck stiffness.  Skin: Negative for rash and wound.  Allergic/Immunologic: Negative for immunocompromised state.  Neurological: Negative for syncope, weakness and headaches.  All other systems reviewed and are negative.    Physical Exam Updated Vital Signs BP 156/76 (BP Location: Right Arm)   Pulse 99   Temp 99.2 F (37.3 C) (Oral)   Resp 20   Ht 5\' 9"  (1.753 m)   Wt (!) 490 lb (222.3 kg)   SpO2 94%   BMI 72.36 kg/m   Physical Exam  Constitutional: He is oriented to person, place, and time. He appears well-developed and well-nourished. No distress.  HENT:  Head: Normocephalic and atraumatic.  Mouth/Throat: Oropharynx is clear and moist.  Eyes: Conjunctivae are normal. Pupils are equal, round, and reactive to light.  Neck: Neck supple.  Cardiovascular: Normal rate, regular rhythm and normal heart sounds.  Exam reveals no friction rub.   No murmur heard. Pulmonary/Chest: Effort normal and breath sounds normal. No respiratory distress. He has no wheezes. He has no rales.    Abdominal: He exhibits no distension. There is no tenderness.  Musculoskeletal: He exhibits no edema.  Neurological: He is alert and oriented to person, place, and time. He exhibits normal muscle tone.  Skin: Skin is warm. Capillary refill takes less than 2 seconds.  Nursing note and vitals reviewed.    ED Treatments / Results  Labs (all labs ordered are listed, but only abnormal results are displayed) Labs Reviewed  BASIC METABOLIC PANEL - Abnormal; Notable for the following:       Result Value   Glucose, Bld 196 (*)    All other components within normal limits  CBC - Abnormal; Notable for the following:    WBC 11.2 (*)    All other components within normal limits  I-STAT TROPOININ, ED    EKG  EKG Interpretation  Date/Time:  Thursday January 29 2016 08:42:07  EDT Ventricular Rate:  101 PR Interval:  146 QRS Duration: 78 QT Interval:  354 QTC Calculation: 459 R Axis:   22 Text Interpretation:  Normal sinus rhythm No significant change since last tracing No acute ST changes Confirmed by Erma HeritageISAACS MD, Sheria LangAMERON [phone]) on 01/29/2016 9:14:07 AM Also confirmed by Erma HeritageISAACS MD, Allure Greaser [phone]), editor Stout CT, Jola BabinskiMarilyn [phone])  on 01/29/2016 9:32:04 AM       Radiology Dg Chest 2 View  Result Date: 01/29/2016 CLINICAL DATA:  Left-sided chest pain for 2 days, initial encounter EXAM: CHEST  2 VIEW COMPARISON:  11/19/2010 FINDINGS: The heart size and mediastinal contours are within normal limits. Both lungs are clear. The visualized skeletal structures are unremarkable. IMPRESSION: No active cardiopulmonary disease. Electronically Signed   By: Alcide CleverMark  Lukens M.D.   On: 01/29/2016 09:18  Procedures Procedures (including critical care time)  Medications Ordered in ED Medications  naproxen (NAPROSYN) tablet 500 mg (500 mg Oral Given 01/29/16 1004)  cyclobenzaprine (FLEXERIL) tablet 10 mg (10 mg Oral Given 01/29/16 1004)     Initial Impression / Assessment and Plan / ED Course  I have reviewed the triage vital signs and the nursing notes.  Pertinent labs & imaging results that were available during my care of the patient were reviewed by me and considered in my medical decision making (see chart for details).  Clinical Course  Value Comment By Time  DG Chest 2 View (Reviewed) Shaune Pollack, MD 08/10 [phone]  DG Chest 2 View (Reviewed) Shaune Pollack, MD 08/10 0915  DG Chest 2 View (Reviewed) Shaune Pollack, MD 08/10 0915  DG Chest 2 View (Reviewed) Shaune Pollack, MD 08/10 0915  WBC: (!) 11.2 Mild leukocytosis, increased from prior; non-specific; no fevers Shaune Pollack, MD 08/10 [phone]  Troponin i, poc: 0.00 Reassuring given constant sx, EKG shows no ischemia Shaune Pollack, MD 08/10 0919  Glucose: (!) 196 Records reviewed, pt has h/o mild hyperglycemia; no  signs of DKA, likely 2/2 obesity, possible DM Shaune Pollack, MD 08/10 [phone]    40 yo M with PMHx of morbid obesity, depression, HTN, testosterone deficiency who presents with constant CP mechanical fall 4 days ago. Pain is positional and reproducible on exam. EKG non-ischemic and trop negative with constant, unchanged pain for 4 days - doubt ACS or coronary etiology. CXR shows no PNA, PTX, rib fractures, or other abnormality. Pt has no significant tachypnea, hypoxia, and sx started directly after fall - doubt occult PTX or PE. No LE edema or swelling. Will treat with NSAIDs, flexeril, and outpatient follow-up. Incentive spirometer given. Pt encouraged to avoid alcohol while taking meds due to OSA. He is compliant with his CPAP. Otherwise, encouraged PCP f/u for his chronic medical issues. Pt in agreement with this plan. Return precautions given.  Final Clinical Impressions(s) / ED Diagnoses   Final diagnoses:  Intercostal pain  Costochondritis    New Prescriptions Discharge Medication List as of 01/29/2016 10:01 AM    START taking these medications   Details  cyclobenzaprine (FLEXERIL) 10 MG tablet Take 1 tablet (10 mg total) by mouth 2 (two) times daily as needed for muscle spasms., Starting Thu 01/29/2016, Print    HYDROcodone-acetaminophen (NORCO/VICODIN) 5-325 MG tablet Take 1 tablet by mouth every 6 (six) hours as needed for severe pain., Starting Thu 01/29/2016, Print    naproxen (NAPROSYN) 500 MG tablet Take 1 tablet (500 mg total) by mouth 2 (two) times daily., Starting Thu 01/29/2016, Print         Shaune Pollack, MD 01/29/16 1028

## 2016-01-29 NOTE — Telephone Encounter (Signed)
Patient walked in this morning to request an appt. States that he fell down the stairs and has pain below the clavicle area.   He spoke to [facility] last night, and they sent us a fax. There is no documentation in the chart, however. He refused ED and urgent care.   I offered the patient an appointment today at 2:45, and he refused. He became irate and demanded to be seen immediatly. I advised that we do not take walkins, and that was the next available that I had. I advised that I could ask the provider. He walked out.

## 2016-07-23 ENCOUNTER — Encounter (HOSPITAL_COMMUNITY): Payer: Self-pay

## 2016-07-23 ENCOUNTER — Ambulatory Visit (HOSPITAL_COMMUNITY)
Admission: EM | Admit: 2016-07-23 | Discharge: 2016-07-23 | Disposition: A | Discharge status: Home / Self Care | Payer: BLUE CROSS/BLUE SHIELD | Attending: Family Medicine | Admitting: Family Medicine

## 2016-07-23 DIAGNOSIS — L234 Allergic contact dermatitis due to dyes: Secondary | ICD-10-CM

## 2016-07-23 MED ORDER — MOMETASONE FUROATE 0.1 % EX CREA: 1.0000 "application " | TOPICAL_CREAM | Freq: Every day | CUTANEOUS | 1 refills | Status: DC

## 2016-07-23 NOTE — Discharge Instructions (Signed)
Cool cloth and cream as advised, return if further problems

## 2016-07-23 NOTE — ED Triage Notes (Signed)
Said when he woke up this morning he noticed his left eye was swollen also has a rash around his lips that he just noticed last night. No itching around his eye. No fever.

## 2016-07-23 NOTE — ED Provider Notes (Signed)
[facility]    CSN: [id number] Arrival date & time: 07/23/16  1317     History   Chief Complaint Chief Complaint  Patient presents with  . Eye Problem    HPI Scott Franklin is a 41 y.o. male.   The history is provided by the patient.  Eye Problem  Location:  Left eye Quality:  Burning Severity:  Mild Duration:  1 day Chronicity:  New Relieved by:  None tried Worsened by:  Nothing Ineffective treatments:  None tried Associated symptoms: facial rash and itching     Past Medical History:  Diagnosis Date  . Anemia   . Cellulitis   . Depression   . Diabetes mellitus without complication (HCC)   . ED (erectile dysfunction)   . Hypertension   . Obesity   . Other testicular hypofunction   . Panhypopituitarism (HCC)   . Sebaceous cyst   . Testosterone deficiency     Patient Active Problem List   Diagnosis Date Noted  . Anemia 04/06/2012  . Routine general medical examination at a health care facility 04/06/2012  . OSA on CPAP 12/21/2010  . Hypogonadism male 04/20/2007  . Obesity, morbid (HCC) 04/10/2007    History reviewed. No pertinent surgical history.     Home Medications    Prior to Admission medications   Medication Sig Start Date End Date Taking? Authorizing Provider  aspirin 325 MG tablet Take 325 mg by mouth daily as needed for mild pain, moderate pain or headache.   Yes Historical Provider, MD  clomiPHENE (CLOMID) 50 MG tablet 1/4 tab daily 07/20/15  Yes Romero Belling, MD  cyclobenzaprine (FLEXERIL) 10 MG tablet Take 1 tablet (10 mg total) by mouth 2 (two) times daily as needed for muscle spasms. 01/29/16   Shaune Pollack, MD  doxycycline (VIBRAMYCIN) 100 MG capsule Take 1 capsule (100 mg total) by mouth 2 (two) times daily. 07/19/15   Shawn C Joy, PA-C  HYDROcodone-acetaminophen (NORCO/VICODIN) 5-325 MG tablet Take 1 tablet by mouth every 6 (six) hours as needed for severe pain. 01/29/16   Shaune Pollack, MD  ibuprofen (ADVIL,MOTRIN) 800  MG tablet Take 1 tablet (800 mg total) by mouth 3 (three) times daily. 07/19/15   Shawn C Joy, PA-C  mometasone (ELOCON) 0.1 % cream Apply 1 application topically daily. 07/23/16   Linna Hoff, MD  naproxen (NAPROSYN) 500 MG tablet Take 1 tablet (500 mg total) by mouth 2 (two) times daily. 01/29/16   Shaune Pollack, MD    Family History Family History  Problem Relation Age of Onset  . Hypertension Father   . Heart attack Father   . Hypertrophic cardiomyopathy Father   . Diabetes Neg Hx     Social History Social History  Substance Use Topics  . Smoking status: Former Smoker    Packs/day: 0.50    Years: 3.00    Types: Cigarettes    Quit date: 06/21/1997  . Smokeless tobacco: Never Used  . Alcohol use No     Allergies   Amoxicillin   Review of Systems Review of Systems  Constitutional: Negative.   HENT: Negative.   Eyes: Positive for itching.  Skin: Positive for rash.  All other systems reviewed and are negative.    Physical Exam Triage Vital Signs ED Triage Vitals  Enc Vitals Group     BP 07/23/16 1405 129/68     Pulse Rate 07/23/16 1405 83     Resp 07/23/16 1405 20     Temp  07/23/16 1405 98 F (36.7 C)     Temp src --      SpO2 07/23/16 1405 97 %     Weight --      Height --      Head Circumference --      Peak Flow --      Pain Score 07/23/16 1404 0     Pain Loc --      Pain Edu? --      Excl. in [place]? --    No data found.   Updated Vital Signs BP 129/68 (BP Location: Left Arm)   Pulse 83   Temp 98 F (36.7 C)   Resp 20   SpO2 97%   Visual Acuity Right Eye Distance:   Left Eye Distance:   Bilateral Distance:    Right Eye Near:   Left Eye Near:    Bilateral Near:     Physical Exam  Constitutional: He is oriented to person, place, and time. He appears well-developed and well-nourished.  Eyes: Conjunctivae and EOM are normal. Pupils are equal, round, and reactive to light. Right eye exhibits no discharge. Left eye exhibits no discharge. No  scleral icterus.  Neck: Normal range of motion. Neck supple.  Neurological: He is alert and oriented to person, place, and time.  Skin: Rash noted. There is erythema.  Nursing note and vitals reviewed.    UC Treatments / Results  Labs (all labs ordered are listed, but only abnormal results are displayed) Labs Reviewed - No data to display  EKG  EKG Interpretation None       Radiology No results found.  Procedures Procedures (including critical care time)  Medications Ordered in UC Medications - No data to display   Initial Impression / Assessment and Plan / UC Course  I have reviewed the triage vital signs and the nursing notes.  Pertinent labs & imaging results that were available during my care of the patient were reviewed by me and considered in my medical decision making (see chart for details).       Final Clinical Impressions(s) / UC Diagnoses   Final diagnoses:  Allergic contact dermatitis due to dyes    New Prescriptions New Prescriptions   MOMETASONE (ELOCON) 0.1 % CREAM    Apply 1 application topically daily.     Linna HoffJames D Tyese Finken, MD 07/23/16 1500

## 2016-08-23 ENCOUNTER — Other Ambulatory Visit: Payer: Self-pay | Admitting: Endocrinology

## 2016-08-23 NOTE — Telephone Encounter (Signed)
Please refill x 1 Ov is due  

## 2016-08-23 NOTE — Telephone Encounter (Signed)
Please advise if ok to refill. Last office visit was 07/18/2015. Thanks!

## 2016-08-26 ENCOUNTER — Telehealth: Payer: Self-pay | Admitting: Endocrinology

## 2016-08-26 NOTE — Telephone Encounter (Signed)
Pt called to schedule an appt with Dr. Everardo AllEllison, offered a few days and times, he wanted labs to be done prior to the appt, let him know that the MD did not need to do that at this time, that he usually sets up labs for after the appt.  Pt proceeded to be very sarcastic in inappropriate while discussing the conduct of the doctors choices and decisions, became irate, asked to speak with a manager, I put him on hold, then got back on the phone and let him know I was the supervisor, he then proceeded to again talk down to me and to the MD.   Pt did make it clear that Dr. Everardo AllEllison was wanting to just have social hour while he had patients in the office and then proceeded to insult his practices, I offered for him to see Dr. Lucianne MussKumar, who does on a regular basis do all labs prior to the visit as he would prefer and felt perhaps they would mesh well while they are both on the same wave length for how visits should be conducted.  He did become upset and said he was no longer going to see  MDs any longer.

## 2018-12-28 DIAGNOSIS — H33321 Round hole, right eye: Secondary | ICD-10-CM | POA: Diagnosis not present

## 2018-12-28 DIAGNOSIS — H15833 Staphyloma posticum, bilateral: Secondary | ICD-10-CM | POA: Diagnosis not present

## 2018-12-28 DIAGNOSIS — H43813 Vitreous degeneration, bilateral: Secondary | ICD-10-CM | POA: Diagnosis not present

## 2018-12-28 DIAGNOSIS — H442A3 Degenerative myopia with choroidal neovascularization, bilateral eye: Secondary | ICD-10-CM | POA: Diagnosis not present

## 2018-12-28 DIAGNOSIS — H269 Unspecified cataract: Secondary | ICD-10-CM | POA: Diagnosis not present

## 2019-02-01 DIAGNOSIS — H43813 Vitreous degeneration, bilateral: Secondary | ICD-10-CM | POA: Diagnosis not present

## 2019-02-01 DIAGNOSIS — H442A3 Degenerative myopia with choroidal neovascularization, bilateral eye: Secondary | ICD-10-CM | POA: Diagnosis not present

## 2019-02-01 DIAGNOSIS — H33321 Round hole, right eye: Secondary | ICD-10-CM | POA: Diagnosis not present

## 2019-02-01 DIAGNOSIS — H15833 Staphyloma posticum, bilateral: Secondary | ICD-10-CM | POA: Diagnosis not present

## 2019-03-01 DIAGNOSIS — H5213 Myopia, bilateral: Secondary | ICD-10-CM | POA: Diagnosis not present

## 2019-03-01 DIAGNOSIS — H442A3 Degenerative myopia with choroidal neovascularization, bilateral eye: Secondary | ICD-10-CM | POA: Diagnosis not present

## 2019-03-01 DIAGNOSIS — H269 Unspecified cataract: Secondary | ICD-10-CM | POA: Diagnosis not present

## 2019-03-01 DIAGNOSIS — H53452 Other localized visual field defect, left eye: Secondary | ICD-10-CM | POA: Diagnosis not present

## 2019-03-01 DIAGNOSIS — H0289 Other specified disorders of eyelid: Secondary | ICD-10-CM | POA: Diagnosis not present

## 2019-03-29 DIAGNOSIS — H442A3 Degenerative myopia with choroidal neovascularization, bilateral eye: Secondary | ICD-10-CM | POA: Diagnosis not present

## 2019-03-29 DIAGNOSIS — H269 Unspecified cataract: Secondary | ICD-10-CM | POA: Diagnosis not present

## 2019-04-26 DIAGNOSIS — H5211 Myopia, right eye: Secondary | ICD-10-CM | POA: Diagnosis not present

## 2019-04-26 DIAGNOSIS — Z9889 Other specified postprocedural states: Secondary | ICD-10-CM | POA: Diagnosis not present

## 2019-04-26 DIAGNOSIS — H33321 Round hole, right eye: Secondary | ICD-10-CM | POA: Diagnosis not present

## 2019-04-26 DIAGNOSIS — H15833 Staphyloma posticum, bilateral: Secondary | ICD-10-CM | POA: Diagnosis not present

## 2019-04-26 DIAGNOSIS — H269 Unspecified cataract: Secondary | ICD-10-CM | POA: Diagnosis not present

## 2019-04-26 DIAGNOSIS — H442A3 Degenerative myopia with choroidal neovascularization, bilateral eye: Secondary | ICD-10-CM | POA: Diagnosis not present

## 2019-05-24 DIAGNOSIS — H442A3 Degenerative myopia with choroidal neovascularization, bilateral eye: Secondary | ICD-10-CM | POA: Diagnosis not present

## 2019-05-24 DIAGNOSIS — H33321 Round hole, right eye: Secondary | ICD-10-CM | POA: Diagnosis not present

## 2019-05-24 DIAGNOSIS — H43813 Vitreous degeneration, bilateral: Secondary | ICD-10-CM | POA: Diagnosis not present

## 2019-05-24 DIAGNOSIS — H15833 Staphyloma posticum, bilateral: Secondary | ICD-10-CM | POA: Diagnosis not present

## 2019-06-07 DIAGNOSIS — H43813 Vitreous degeneration, bilateral: Secondary | ICD-10-CM | POA: Diagnosis not present

## 2019-06-07 DIAGNOSIS — H442A3 Degenerative myopia with choroidal neovascularization, bilateral eye: Secondary | ICD-10-CM | POA: Diagnosis not present

## 2019-06-07 DIAGNOSIS — H15833 Staphyloma posticum, bilateral: Secondary | ICD-10-CM | POA: Diagnosis not present

## 2019-06-07 DIAGNOSIS — H442A2 Degenerative myopia with choroidal neovascularization, left eye: Secondary | ICD-10-CM | POA: Diagnosis not present

## 2019-06-28 DIAGNOSIS — H33321 Round hole, right eye: Secondary | ICD-10-CM | POA: Diagnosis not present

## 2019-06-28 DIAGNOSIS — H442A3 Degenerative myopia with choroidal neovascularization, bilateral eye: Secondary | ICD-10-CM | POA: Diagnosis not present

## 2019-06-28 DIAGNOSIS — H442A2 Degenerative myopia with choroidal neovascularization, left eye: Secondary | ICD-10-CM | POA: Diagnosis not present

## 2019-06-28 DIAGNOSIS — H43813 Vitreous degeneration, bilateral: Secondary | ICD-10-CM | POA: Diagnosis not present

## 2019-06-28 DIAGNOSIS — H15833 Staphyloma posticum, bilateral: Secondary | ICD-10-CM | POA: Diagnosis not present

## 2019-07-26 DIAGNOSIS — H15833 Staphyloma posticum, bilateral: Secondary | ICD-10-CM | POA: Diagnosis not present

## 2019-07-26 DIAGNOSIS — H442A3 Degenerative myopia with choroidal neovascularization, bilateral eye: Secondary | ICD-10-CM | POA: Diagnosis not present

## 2019-07-26 DIAGNOSIS — H43813 Vitreous degeneration, bilateral: Secondary | ICD-10-CM | POA: Diagnosis not present

## 2019-07-26 DIAGNOSIS — H33321 Round hole, right eye: Secondary | ICD-10-CM | POA: Diagnosis not present

## 2019-07-26 DIAGNOSIS — H442A2 Degenerative myopia with choroidal neovascularization, left eye: Secondary | ICD-10-CM | POA: Diagnosis not present

## 2019-09-06 DIAGNOSIS — H43813 Vitreous degeneration, bilateral: Secondary | ICD-10-CM | POA: Diagnosis not present

## 2019-09-06 DIAGNOSIS — H442A2 Degenerative myopia with choroidal neovascularization, left eye: Secondary | ICD-10-CM | POA: Diagnosis not present

## 2019-09-06 DIAGNOSIS — H33321 Round hole, right eye: Secondary | ICD-10-CM | POA: Diagnosis not present

## 2019-09-12 DIAGNOSIS — H2513 Age-related nuclear cataract, bilateral: Secondary | ICD-10-CM | POA: Diagnosis not present

## 2019-10-03 DIAGNOSIS — H18603 Keratoconus, unspecified, bilateral: Secondary | ICD-10-CM | POA: Diagnosis not present

## 2019-10-03 DIAGNOSIS — H2511 Age-related nuclear cataract, right eye: Secondary | ICD-10-CM | POA: Diagnosis not present

## 2019-10-06 DIAGNOSIS — Z01812 Encounter for preprocedural laboratory examination: Secondary | ICD-10-CM | POA: Diagnosis not present

## 2019-10-06 DIAGNOSIS — Z20822 Contact with and (suspected) exposure to covid-19: Secondary | ICD-10-CM | POA: Diagnosis not present

## 2019-10-08 DIAGNOSIS — Z87891 Personal history of nicotine dependence: Secondary | ICD-10-CM | POA: Diagnosis not present

## 2019-10-08 DIAGNOSIS — Z880 Allergy status to penicillin: Secondary | ICD-10-CM | POA: Diagnosis not present

## 2019-10-08 DIAGNOSIS — Z6841 Body Mass Index (BMI) 40.0 and over, adult: Secondary | ICD-10-CM | POA: Diagnosis not present

## 2019-10-08 DIAGNOSIS — Z791 Long term (current) use of non-steroidal anti-inflammatories (NSAID): Secondary | ICD-10-CM | POA: Diagnosis not present

## 2019-10-08 DIAGNOSIS — H2511 Age-related nuclear cataract, right eye: Secondary | ICD-10-CM | POA: Diagnosis not present

## 2019-10-08 DIAGNOSIS — H538 Other visual disturbances: Secondary | ICD-10-CM | POA: Diagnosis not present

## 2019-10-08 DIAGNOSIS — G4733 Obstructive sleep apnea (adult) (pediatric): Secondary | ICD-10-CM | POA: Diagnosis not present

## 2019-10-08 DIAGNOSIS — H2510 Age-related nuclear cataract, unspecified eye: Secondary | ICD-10-CM | POA: Diagnosis not present

## 2019-10-22 ENCOUNTER — Ambulatory Visit (HOSPITAL_COMMUNITY)
Admission: EM | Admit: 2019-10-22 | Discharge: 2019-10-22 | Disposition: A | Discharge status: Home / Self Care | Payer: BC Managed Care – PPO | Attending: Internal Medicine | Admitting: Internal Medicine

## 2019-10-22 ENCOUNTER — Encounter (HOSPITAL_COMMUNITY): Payer: Self-pay

## 2019-10-22 ENCOUNTER — Other Ambulatory Visit: Payer: Self-pay

## 2019-10-22 DIAGNOSIS — K645 Perianal venous thrombosis: Secondary | ICD-10-CM

## 2019-10-22 MED ORDER — HYDROCORTISONE ACETATE 25 MG RE SUPP: 25.0000 mg | Freq: Two times a day (BID) | RECTAL | 0 refills | Status: DC

## 2019-10-22 MED ORDER — KETOROLAC TROMETHAMINE 60 MG/2ML IM SOLN
INTRAMUSCULAR | Status: AC
  Filled 2019-10-22: qty 2

## 2019-10-22 MED ORDER — KETOROLAC TROMETHAMINE 60 MG/2ML IM SOLN
60.0000 mg | Freq: Once | INTRAMUSCULAR | Status: AC
  Administered 2019-10-22: 60 mg via INTRAMUSCULAR

## 2019-10-22 NOTE — ED Triage Notes (Signed)
Pt is here with rectal pain for 4 days now. Pt has taken Advil to relieve discomfort, states it helps some.

## 2019-10-23 ENCOUNTER — Emergency Department (HOSPITAL_COMMUNITY)
Admission: EM | Admit: 2019-10-23 | Discharge: 2019-10-23 | Disposition: A | Discharge status: Home / Self Care | Payer: BC Managed Care – PPO | Attending: Emergency Medicine | Admitting: Emergency Medicine

## 2019-10-23 ENCOUNTER — Encounter (HOSPITAL_COMMUNITY): Payer: Self-pay | Admitting: Emergency Medicine

## 2019-10-23 DIAGNOSIS — E119 Type 2 diabetes mellitus without complications: Secondary | ICD-10-CM | POA: Diagnosis not present

## 2019-10-23 DIAGNOSIS — K6289 Other specified diseases of anus and rectum: Secondary | ICD-10-CM | POA: Diagnosis not present

## 2019-10-23 DIAGNOSIS — Z7984 Long term (current) use of oral hypoglycemic drugs: Secondary | ICD-10-CM | POA: Insufficient documentation

## 2019-10-23 DIAGNOSIS — Z79899 Other long term (current) drug therapy: Secondary | ICD-10-CM | POA: Diagnosis not present

## 2019-10-23 DIAGNOSIS — K610 Anal abscess: Secondary | ICD-10-CM | POA: Insufficient documentation

## 2019-10-23 DIAGNOSIS — Z87891 Personal history of nicotine dependence: Secondary | ICD-10-CM | POA: Insufficient documentation

## 2019-10-23 DIAGNOSIS — I10 Essential (primary) hypertension: Secondary | ICD-10-CM | POA: Diagnosis not present

## 2019-10-23 MED ORDER — METRONIDAZOLE 500 MG PO TABS: 500.0000 mg | ORAL_TABLET | Freq: Two times a day (BID) | ORAL | 0 refills | Status: AC

## 2019-10-23 MED ORDER — CIPROFLOXACIN HCL 500 MG PO TABS: 500.0000 mg | ORAL_TABLET | Freq: Two times a day (BID) | ORAL | 0 refills | Status: DC

## 2019-10-23 NOTE — ED Provider Notes (Signed)
[facility]    CSN: [id number] Arrival date & time: 10/22/19  0818      History   Chief Complaint Chief Complaint  Patient presents with  . Hemorrhoids    HPI Scott Franklin is a 44 y.o. male comes to the urgent care with a 4-day history of rectal pain.  Patient describes the pain as throbbing,, severe, aggravated by sitting or walking and not relieved by taking NSAIDs.  He denies any fever chills.  Patient has a sensation of mass in his anus.  He says that he may have hemorrhoids.  No nausea or vomiting.  Patient denies any history of constipation.   HPI  Past Medical History:  Diagnosis Date  . Anemia   . Cellulitis   . Depression   . Diabetes mellitus without complication (HCC)   . ED (erectile dysfunction)   . Hypertension   . Obesity   . Other testicular hypofunction   . Panhypopituitarism (HCC)   . Sebaceous cyst   . Testosterone deficiency     Patient Active Problem List   Diagnosis Date Noted  . Anemia 04/06/2012  . Routine general medical examination at a health care facility 04/06/2012  . OSA on CPAP 12/21/2010  . Hypogonadism male 04/20/2007  . Obesity, morbid (HCC) 04/10/2007    History reviewed. No pertinent surgical history.     Home Medications    Prior to Admission medications   Medication Sig Start Date End Date Taking? Authorizing Provider  ketorolac (ACULAR) 0.5 % ophthalmic solution Place 1 drop into the right eye 3 (three) times daily.  09/24/19  Yes [provider]  ciprofloxacin (CIPRO) 500 MG tablet Take 1 tablet (500 mg total) by mouth 2 (two) times daily. 10/23/19   Couture, Cortni S, PA-C  fluorometholone (FML) 0.1 % ophthalmic suspension Place 1 drop into the left eye in the morning.  10/21/19   [provider]  hydrocortisone (ANUSOL-HC) 25 MG suppository Place 1 suppository (25 mg total) rectally 2 (two) times daily. Patient not taking: Reported on 10/23/2019 10/22/19   Merrilee Jansky, MD  metroNIDAZOLE  (FLAGYL) 500 MG tablet Take 1 tablet (500 mg total) by mouth 2 (two) times daily for 7 days. 10/23/19 10/30/19  Couture, Cortni S, PA-C  multivitamin (ONE-A-DAY MEN'S) TABS tablet Take 1 tablet by mouth daily with breakfast.    [provider]  prednisoLONE acetate (PRED FORTE) 1 % ophthalmic suspension Place 1 drop into the right eye 3 (three) times daily. 10/08/19   [provider]  PROCTOZONE-HC 2.5 % rectal cream Place 1 application rectally in the morning and at bedtime.  10/22/19   [provider]    Family History Family History  Problem Relation Age of Onset  . Hypertension Father   . Heart attack Father   . Hypertrophic cardiomyopathy Father   . Healthy Mother   . Diabetes Neg Hx     Social History Social History   Tobacco Use  . Smoking status: Former Smoker    Packs/day: 0.50    Years: 3.00    Pack years: 1.50    Types: Cigarettes    Quit date: 06/21/1997    Years since quitting: 22.3  . Smokeless tobacco: Never Used  Substance Use Topics  . Alcohol use: No  . Drug use: No     Allergies   Amoxicillin, Other, Soap, Corn oil, and Corn starch   Review of Systems Review of Systems  Constitutional: Negative for chills, fatigue and  fever.  HENT: Negative.   Respiratory: Negative.   Gastrointestinal: Positive for rectal pain. Negative for abdominal pain, nausea and vomiting.  Genitourinary: Negative for dysuria, frequency, scrotal swelling, testicular pain and urgency.  Musculoskeletal: Negative.   Skin: Negative.   Neurological: Negative.      Physical Exam Triage Vital Signs ED Triage Vitals  Enc Vitals Group     BP 10/22/19 0827 138/86     Pulse Rate 10/22/19 0827 79     Resp 10/22/19 0827 (!) 23     Temp 10/22/19 0827 98.3 F (36.8 C)     Temp Source 10/22/19 0827 Oral     SpO2 10/22/19 0827 96 %     Weight 10/22/19 0831 (!) 461 lb (209.1 kg)     Height --      Head Circumference --      Peak Flow --      Pain Score 10/22/19  0831 10     Pain Loc --      Pain Edu? --      Excl. in [place]? --    No data found.  Updated Vital Signs BP 138/86 (BP Location: Left Arm)   Pulse 79   Temp 98.3 F (36.8 C) (Oral)   Resp (!) 23   Wt (!) 209.1 kg   SpO2 96%   BMI 68.08 kg/m   Visual Acuity Right Eye Distance:   Left Eye Distance:   Bilateral Distance:    Right Eye Near:   Left Eye Near:    Bilateral Near:     Physical Exam Constitutional:      General: He is in acute distress.  Cardiovascular:     Pulses: Normal pulses.     Heart sounds: Normal heart sounds.  Pulmonary:     Effort: Pulmonary effort is normal.     Breath sounds: Normal breath sounds.  Abdominal:     General: Bowel sounds are normal.     Palpations: Abdomen is soft.  Genitourinary:    Comments: Anal cellulitis tender swelling in the 3 o'clock position as well as 11 o'clock position.  Tender to palpation.  No discharge. Neurological:     Mental Status: He is alert.      UC Treatments / Results  Labs (all labs ordered are listed, but only abnormal results are displayed) Labs Reviewed - No data to display  EKG   Radiology No results found.  Procedures Procedures (including critical care time)  Medications Ordered in UC Medications  ketorolac (TORADOL) injection 60 mg (60 mg Intramuscular Given 10/22/19 0912)    Initial Impression / Assessment and Plan / UC Course  I have reviewed the triage vital signs and the nursing notes.  Pertinent labs & imaging results that were available during my care of the patient were reviewed by me and considered in my medical decision making (see chart for details).     1.  Thrombosed external hemorrhoids: Continue ibuprofen as needed for pain Anusol rectal suppository Return precautions given Stool softeners will be helpful If patient develops any discharge, fever or chills patient is advised to return to urgent care to be evaluated. Final Clinical Impressions(s) / UC Diagnoses    Final diagnoses:  Thrombosed external hemorrhoids   Discharge Instructions   None    ED Prescriptions    Medication Sig Dispense Auth. Provider   hydrocortisone (ANUSOL-HC) 25 MG suppository Place 1 suppository (25 mg total) rectally 2 (two) times daily. Patient not taking:  Reported on 10/23/2019  20 suppository Deshanti Adcox, Britta Mccreedy, MD     PDMP not reviewed this encounter.   Merrilee Jansky, MD 10/23/19 2059

## 2019-10-23 NOTE — ED Triage Notes (Signed)
Pt states he was seen at urgent care yesterday and diagnosed with thrombosed hemorrhoids and believes ones popped this morning because when he wipes he's sees a little blood and brown fluid. States it does not have a foul odor and states " I was concerned this was infection".

## 2019-10-23 NOTE — ED Provider Notes (Addendum)
[facility] EMERGENCY DEPARTMENT Provider Note   CSN: [id number] Arrival date & time: 10/23/19  1332     History Chief Complaint  Patient presents with  . Hemorrhoids    Scott Franklin is a 44 y.o. male.  HPI   Patient is a 44 year old male with a history of anemia, cellulitis, depression, diabetes, ED, hypertension, obesity, who presents the emergency department today for evaluation of rectal pain.  States he was seen in urgent care was diagnosed with a external thrombosed hemorrhoid.  He was given medication for this.  States that yesterday he feels like the burst did and he noticed some bleeding and also some drainage of pus.  He is concerned he may have an infection.  He denies any systemic symptoms or fevers at home  Past Medical History:  Diagnosis Date  . Anemia   . Cellulitis   . Depression   . Diabetes mellitus without complication (HCC)   . ED (erectile dysfunction)   . Hypertension   . Obesity   . Other testicular hypofunction   . Panhypopituitarism (HCC)   . Sebaceous cyst   . Testosterone deficiency     Patient Active Problem List   Diagnosis Date Noted  . Anemia 04/06/2012  . Routine general medical examination at a health care facility 04/06/2012  . OSA on CPAP 12/21/2010  . Hypogonadism male 04/20/2007  . Obesity, morbid (HCC) 04/10/2007    History reviewed. No pertinent surgical history.     Family History  Problem Relation Age of Onset  . Hypertension Father   . Heart attack Father   . Hypertrophic cardiomyopathy Father   . Healthy Mother   . Diabetes Neg Hx     Social History   Tobacco Use  . Smoking status: Former Smoker    Packs/day: 0.50    Years: 3.00    Pack years: 1.50    Types: Cigarettes    Quit date: 06/21/1997    Years since quitting: 22.3  . Smokeless tobacco: Never Used  Substance Use Topics  . Alcohol use: No  . Drug use: No    Home Medications Prior to Admission medications   Medication Sig  Start Date End Date Taking? Authorizing Provider  fluorometholone (FML) 0.1 % ophthalmic suspension Place 1 drop into the left eye in the morning.  10/21/19  Yes [provider]  ketorolac (ACULAR) 0.5 % ophthalmic solution Place 1 drop into the right eye 3 (three) times daily.  09/24/19  Yes [provider]  multivitamin (ONE-A-DAY MEN'S) TABS tablet Take 1 tablet by mouth daily with breakfast.   Yes [provider]  prednisoLONE acetate (PRED FORTE) 1 % ophthalmic suspension Place 1 drop into the right eye 3 (three) times daily. 10/08/19  Yes [provider]  PROCTOZONE-HC 2.5 % rectal cream Place 1 application rectally in the morning and at bedtime.  10/22/19  Yes [provider]  ciprofloxacin (CIPRO) 500 MG tablet Take 1 tablet (500 mg total) by mouth 2 (two) times daily. 10/23/19   Nyelle Wolfson S, PA-C  hydrocortisone (ANUSOL-HC) 25 MG suppository Place 1 suppository (25 mg total) rectally 2 (two) times daily. Patient not taking: Reported on 10/23/2019 10/22/19   Merrilee Jansky, MD  metroNIDAZOLE (FLAGYL) 500 MG tablet Take 1 tablet (500 mg total) by mouth 2 (two) times daily for 7 days. 10/23/19 10/30/19  Pranshu Lyster S, PA-C    Allergies    Amoxicillin, Other, Soap, Corn oil, and Corn starch  Review of Systems   Review of Systems  Constitutional: Negative for fever.  Gastrointestinal: Negative for abdominal pain.       Rectal pain  Musculoskeletal: Negative for back pain.  Skin: Positive for color change and wound.  Neurological: Negative for headaches.    Physical Exam Updated Vital Signs BP 94/77 (BP Location: Left Arm)   Pulse 94   Temp 100.2 F (37.9 C) (Oral)   Resp (!) 24   SpO2 96%   Physical Exam Constitutional:      General: He is not in acute distress.    Appearance: He is well-developed.  Eyes:     Conjunctiva/sclera: Conjunctivae normal.  Cardiovascular:     Rate and Rhythm: Normal rate and regular rhythm.  Pulmonary:      Effort: Pulmonary effort is normal.     Breath sounds: Normal breath sounds.  Genitourinary:    Comments: Chaperone present. DRE performed there is no fluctuance or TTP on rectal exam though he does have an area of swelling and fluctuance to the perianal area that is about 3cmx2cm and is actively draining purulent material.  Skin:    General: Skin is warm and dry.  Neurological:     Mental Status: He is alert and oriented to person, place, and time.           ED Results / Procedures / Treatments   Labs (all labs ordered are listed, but only abnormal results are displayed) Labs Reviewed - No data to display  EKG None  Radiology No results found.  Procedures Procedures (including critical care time)  Medications Ordered in ED Medications - No data to display  ED Course  I have reviewed the triage vital signs and the nursing notes.  Pertinent labs & imaging results that were available during my care of the patient were reviewed by me and considered in my medical decision making (see chart for details).    MDM Rules/Calculators/A&P                      45 year old male presenting with rectal pain and pus drainage concerning for abscess.  On exam he does appear that he has a perianal abscess.  No evidence of perirectal involvement.  He is not systemically ill.  The abscess is already draining.  I will start him on antibiotics and have him follow-up with general surgery.  Have advised him to return to the ED for any new or worsening symptoms.  He voiced understanding plan reasons to return.  Questions answered.  Patient stable for discharge.  Nursing did not notify me that upon discharge, his temp had increased to 100.46F and his BP had decreased to 94 systolic. At 7:10 PM I contacted the patient to discuss his sxs. He states his sxs are currently stable and he feels fine. He was able to fill his rx for his abx today. I discussed that he needs to closely monitor his sxs and  if he has any worsening of his sxs at all he will need to return to the emergency department for further evaluation and workup. He should also return to the ED if he is unable to get an appt with general surgery within the next few days.  Final Clinical Impression(s) / ED Diagnoses Final diagnoses:  Perianal abscess    Rx / DC Orders ED Discharge Orders         Ordered    ciprofloxacin (CIPRO) 500 MG tablet  2 times daily  10/23/19 1621    metroNIDAZOLE (FLAGYL) 500 MG tablet  2 times daily     10/23/19 1621           Jonathen Rathman S, PA-C 10/23/19 1622    Caylin Raby S, PA-C 10/23/19 1912    Derwood Kaplan, MD 10/25/19 1921

## 2019-10-23 NOTE — Discharge Instructions (Addendum)
You were given a prescription for antibiotics. Please take the antibiotic prescription fully.   You need to do warm soaks at home in the tub to help encourage drainage from the wound.  You should use baby wipes when you have a bowel movement rather than toilet paper.    You were given information to follow-up with the general surgery office in regards to your symptoms.  You will need to call the office tomorrow to schedule an appointment for follow-up.  Please return to the emergency room immediately if you experience any new or worsening symptoms or any symptoms that indicate worsening infection such as fevers, increased redness/swelling/pain, warmth, or drainage from the affected area.

## 2019-10-25 DIAGNOSIS — K610 Anal abscess: Secondary | ICD-10-CM | POA: Diagnosis not present

## 2019-11-01 DIAGNOSIS — H15833 Staphyloma posticum, bilateral: Secondary | ICD-10-CM | POA: Diagnosis not present

## 2019-11-01 DIAGNOSIS — H33321 Round hole, right eye: Secondary | ICD-10-CM | POA: Diagnosis not present

## 2019-11-01 DIAGNOSIS — H43813 Vitreous degeneration, bilateral: Secondary | ICD-10-CM | POA: Diagnosis not present

## 2019-11-01 DIAGNOSIS — H442A3 Degenerative myopia with choroidal neovascularization, bilateral eye: Secondary | ICD-10-CM | POA: Diagnosis not present

## 2020-01-03 DIAGNOSIS — H33321 Round hole, right eye: Secondary | ICD-10-CM | POA: Diagnosis not present

## 2020-01-03 DIAGNOSIS — H15833 Staphyloma posticum, bilateral: Secondary | ICD-10-CM | POA: Diagnosis not present

## 2020-01-03 DIAGNOSIS — H442A3 Degenerative myopia with choroidal neovascularization, bilateral eye: Secondary | ICD-10-CM | POA: Diagnosis not present

## 2020-01-03 DIAGNOSIS — H269 Unspecified cataract: Secondary | ICD-10-CM | POA: Diagnosis not present

## 2020-01-03 DIAGNOSIS — Z961 Presence of intraocular lens: Secondary | ICD-10-CM | POA: Diagnosis not present

## 2020-01-03 DIAGNOSIS — H43813 Vitreous degeneration, bilateral: Secondary | ICD-10-CM | POA: Diagnosis not present

## 2020-01-08 DIAGNOSIS — H18611 Keratoconus, stable, right eye: Secondary | ICD-10-CM | POA: Diagnosis not present

## 2020-01-08 DIAGNOSIS — H442A3 Degenerative myopia with choroidal neovascularization, bilateral eye: Secondary | ICD-10-CM | POA: Diagnosis not present

## 2020-01-08 DIAGNOSIS — H18622 Keratoconus, unstable, left eye: Secondary | ICD-10-CM | POA: Diagnosis not present

## 2020-02-28 DIAGNOSIS — H15833 Staphyloma posticum, bilateral: Secondary | ICD-10-CM | POA: Diagnosis not present

## 2020-02-28 DIAGNOSIS — H442A3 Degenerative myopia with choroidal neovascularization, bilateral eye: Secondary | ICD-10-CM | POA: Diagnosis not present

## 2020-02-28 DIAGNOSIS — Z961 Presence of intraocular lens: Secondary | ICD-10-CM | POA: Diagnosis not present

## 2020-02-28 DIAGNOSIS — H269 Unspecified cataract: Secondary | ICD-10-CM | POA: Diagnosis not present

## 2020-02-28 DIAGNOSIS — H43813 Vitreous degeneration, bilateral: Secondary | ICD-10-CM | POA: Diagnosis not present

## 2020-02-28 DIAGNOSIS — H33321 Round hole, right eye: Secondary | ICD-10-CM | POA: Diagnosis not present

## 2020-04-24 DIAGNOSIS — H18603 Keratoconus, unspecified, bilateral: Secondary | ICD-10-CM | POA: Diagnosis not present

## 2020-04-24 DIAGNOSIS — H15833 Staphyloma posticum, bilateral: Secondary | ICD-10-CM | POA: Diagnosis not present

## 2020-04-24 DIAGNOSIS — H33321 Round hole, right eye: Secondary | ICD-10-CM | POA: Diagnosis not present

## 2020-04-24 DIAGNOSIS — H2512 Age-related nuclear cataract, left eye: Secondary | ICD-10-CM | POA: Diagnosis not present

## 2020-04-24 DIAGNOSIS — H43813 Vitreous degeneration, bilateral: Secondary | ICD-10-CM | POA: Diagnosis not present

## 2020-04-24 DIAGNOSIS — H442A3 Degenerative myopia with choroidal neovascularization, bilateral eye: Secondary | ICD-10-CM | POA: Diagnosis not present

## 2020-04-28 DIAGNOSIS — H2510 Age-related nuclear cataract, unspecified eye: Secondary | ICD-10-CM | POA: Diagnosis not present

## 2020-04-28 DIAGNOSIS — G4733 Obstructive sleep apnea (adult) (pediatric): Secondary | ICD-10-CM | POA: Diagnosis not present

## 2020-04-28 DIAGNOSIS — Z880 Allergy status to penicillin: Secondary | ICD-10-CM | POA: Diagnosis not present

## 2020-04-28 DIAGNOSIS — H2512 Age-related nuclear cataract, left eye: Secondary | ICD-10-CM | POA: Diagnosis not present

## 2020-04-28 DIAGNOSIS — Z6841 Body Mass Index (BMI) 40.0 and over, adult: Secondary | ICD-10-CM | POA: Diagnosis not present

## 2020-04-28 DIAGNOSIS — Z87891 Personal history of nicotine dependence: Secondary | ICD-10-CM | POA: Diagnosis not present

## 2020-04-28 DIAGNOSIS — Z947 Corneal transplant status: Secondary | ICD-10-CM | POA: Diagnosis not present

## 2020-05-05 DIAGNOSIS — Z20822 Contact with and (suspected) exposure to covid-19: Secondary | ICD-10-CM | POA: Diagnosis not present

## 2020-05-30 DIAGNOSIS — H18603 Keratoconus, unspecified, bilateral: Secondary | ICD-10-CM | POA: Diagnosis not present

## 2020-06-03 DIAGNOSIS — H18603 Keratoconus, unspecified, bilateral: Secondary | ICD-10-CM | POA: Diagnosis not present

## 2020-06-03 DIAGNOSIS — H15833 Staphyloma posticum, bilateral: Secondary | ICD-10-CM | POA: Diagnosis not present

## 2020-06-03 DIAGNOSIS — H442A3 Degenerative myopia with choroidal neovascularization, bilateral eye: Secondary | ICD-10-CM | POA: Diagnosis not present

## 2020-06-03 DIAGNOSIS — H43813 Vitreous degeneration, bilateral: Secondary | ICD-10-CM | POA: Diagnosis not present

## 2020-06-04 DIAGNOSIS — H33012 Retinal detachment with single break, left eye: Secondary | ICD-10-CM | POA: Diagnosis not present

## 2020-06-04 DIAGNOSIS — H442A3 Degenerative myopia with choroidal neovascularization, bilateral eye: Secondary | ICD-10-CM | POA: Diagnosis not present

## 2020-06-04 DIAGNOSIS — H43813 Vitreous degeneration, bilateral: Secondary | ICD-10-CM | POA: Diagnosis not present

## 2020-06-04 DIAGNOSIS — H18603 Keratoconus, unspecified, bilateral: Secondary | ICD-10-CM | POA: Diagnosis not present

## 2020-06-05 DIAGNOSIS — H5212 Myopia, left eye: Secondary | ICD-10-CM | POA: Diagnosis not present

## 2020-06-05 DIAGNOSIS — G4733 Obstructive sleep apnea (adult) (pediatric): Secondary | ICD-10-CM | POA: Diagnosis not present

## 2020-06-05 DIAGNOSIS — Z961 Presence of intraocular lens: Secondary | ICD-10-CM | POA: Diagnosis not present

## 2020-06-05 DIAGNOSIS — H33002 Unspecified retinal detachment with retinal break, left eye: Secondary | ICD-10-CM | POA: Diagnosis not present

## 2020-06-05 DIAGNOSIS — Z87891 Personal history of nicotine dependence: Secondary | ICD-10-CM | POA: Diagnosis not present

## 2020-06-05 DIAGNOSIS — I10 Essential (primary) hypertension: Secondary | ICD-10-CM | POA: Diagnosis not present

## 2020-06-05 DIAGNOSIS — Z9842 Cataract extraction status, left eye: Secondary | ICD-10-CM | POA: Diagnosis not present

## 2020-06-05 DIAGNOSIS — E1151 Type 2 diabetes mellitus with diabetic peripheral angiopathy without gangrene: Secondary | ICD-10-CM | POA: Diagnosis not present

## 2020-06-05 DIAGNOSIS — Z9889 Other specified postprocedural states: Secondary | ICD-10-CM | POA: Diagnosis not present

## 2020-06-05 DIAGNOSIS — H3322 Serous retinal detachment, left eye: Secondary | ICD-10-CM | POA: Diagnosis not present

## 2020-06-05 DIAGNOSIS — Z79899 Other long term (current) drug therapy: Secondary | ICD-10-CM | POA: Diagnosis not present

## 2020-06-05 DIAGNOSIS — Z9841 Cataract extraction status, right eye: Secondary | ICD-10-CM | POA: Diagnosis not present

## 2020-06-05 DIAGNOSIS — R54 Age-related physical debility: Secondary | ICD-10-CM | POA: Diagnosis not present

## 2020-06-05 DIAGNOSIS — D649 Anemia, unspecified: Secondary | ICD-10-CM | POA: Diagnosis not present

## 2020-06-05 DIAGNOSIS — Z6841 Body Mass Index (BMI) 40.0 and over, adult: Secondary | ICD-10-CM | POA: Diagnosis not present

## 2020-06-05 DIAGNOSIS — H338 Other retinal detachments: Secondary | ICD-10-CM | POA: Diagnosis not present

## 2020-06-17 DIAGNOSIS — H33012 Retinal detachment with single break, left eye: Secondary | ICD-10-CM | POA: Diagnosis not present

## 2020-06-26 DIAGNOSIS — Z8669 Personal history of other diseases of the nervous system and sense organs: Secondary | ICD-10-CM | POA: Diagnosis not present

## 2020-06-26 DIAGNOSIS — H442A3 Degenerative myopia with choroidal neovascularization, bilateral eye: Secondary | ICD-10-CM | POA: Diagnosis not present

## 2020-06-26 DIAGNOSIS — H43811 Vitreous degeneration, right eye: Secondary | ICD-10-CM | POA: Diagnosis not present

## 2020-07-15 DIAGNOSIS — H33012 Retinal detachment with single break, left eye: Secondary | ICD-10-CM | POA: Diagnosis not present

## 2020-08-01 DIAGNOSIS — D224 Melanocytic nevi of scalp and neck: Secondary | ICD-10-CM | POA: Diagnosis not present

## 2020-08-01 DIAGNOSIS — D2261 Melanocytic nevi of right upper limb, including shoulder: Secondary | ICD-10-CM | POA: Diagnosis not present

## 2020-08-01 DIAGNOSIS — L218 Other seborrheic dermatitis: Secondary | ICD-10-CM | POA: Diagnosis not present

## 2020-08-01 DIAGNOSIS — L918 Other hypertrophic disorders of the skin: Secondary | ICD-10-CM | POA: Diagnosis not present

## 2020-08-01 DIAGNOSIS — D2262 Melanocytic nevi of left upper limb, including shoulder: Secondary | ICD-10-CM | POA: Diagnosis not present

## 2020-08-19 DIAGNOSIS — H33012 Retinal detachment with single break, left eye: Secondary | ICD-10-CM | POA: Diagnosis not present

## 2020-08-21 DIAGNOSIS — H15833 Staphyloma posticum, bilateral: Secondary | ICD-10-CM | POA: Diagnosis not present

## 2020-08-21 DIAGNOSIS — Z961 Presence of intraocular lens: Secondary | ICD-10-CM | POA: Diagnosis not present

## 2020-08-21 DIAGNOSIS — Z9889 Other specified postprocedural states: Secondary | ICD-10-CM | POA: Diagnosis not present

## 2020-08-21 DIAGNOSIS — H33321 Round hole, right eye: Secondary | ICD-10-CM | POA: Diagnosis not present

## 2020-08-21 DIAGNOSIS — Z8669 Personal history of other diseases of the nervous system and sense organs: Secondary | ICD-10-CM | POA: Diagnosis not present

## 2020-08-21 DIAGNOSIS — H442A3 Degenerative myopia with choroidal neovascularization, bilateral eye: Secondary | ICD-10-CM | POA: Diagnosis not present

## 2020-08-21 DIAGNOSIS — H18603 Keratoconus, unspecified, bilateral: Secondary | ICD-10-CM | POA: Diagnosis not present

## 2020-08-21 DIAGNOSIS — H43811 Vitreous degeneration, right eye: Secondary | ICD-10-CM | POA: Diagnosis not present

## 2020-08-28 ENCOUNTER — Emergency Department (HOSPITAL_COMMUNITY): Payer: BC Managed Care – PPO

## 2020-08-28 ENCOUNTER — Other Ambulatory Visit: Payer: Self-pay

## 2020-08-28 ENCOUNTER — Encounter (HOSPITAL_COMMUNITY): Payer: Self-pay | Admitting: Pharmacy Technician

## 2020-08-28 ENCOUNTER — Observation Stay (HOSPITAL_COMMUNITY)
Admission: EM | Admit: 2020-08-28 | Discharge: 2020-08-29 | Disposition: A | Discharge status: Home / Self Care | Payer: BC Managed Care – PPO | Attending: Internal Medicine | Admitting: Internal Medicine

## 2020-08-28 DIAGNOSIS — Z79899 Other long term (current) drug therapy: Secondary | ICD-10-CM | POA: Diagnosis not present

## 2020-08-28 DIAGNOSIS — I10 Essential (primary) hypertension: Secondary | ICD-10-CM | POA: Insufficient documentation

## 2020-08-28 DIAGNOSIS — Z794 Long term (current) use of insulin: Secondary | ICD-10-CM | POA: Insufficient documentation

## 2020-08-28 DIAGNOSIS — Z87891 Personal history of nicotine dependence: Secondary | ICD-10-CM | POA: Insufficient documentation

## 2020-08-28 DIAGNOSIS — Z20822 Contact with and (suspected) exposure to covid-19: Secondary | ICD-10-CM | POA: Diagnosis not present

## 2020-08-28 DIAGNOSIS — R072 Precordial pain: Secondary | ICD-10-CM | POA: Diagnosis not present

## 2020-08-28 DIAGNOSIS — Z7984 Long term (current) use of oral hypoglycemic drugs: Secondary | ICD-10-CM | POA: Diagnosis not present

## 2020-08-28 DIAGNOSIS — R079 Chest pain, unspecified: Secondary | ICD-10-CM | POA: Diagnosis not present

## 2020-08-28 DIAGNOSIS — R0602 Shortness of breath: Secondary | ICD-10-CM | POA: Diagnosis not present

## 2020-08-28 DIAGNOSIS — R0789 Other chest pain: Secondary | ICD-10-CM | POA: Diagnosis not present

## 2020-08-28 DIAGNOSIS — E1165 Type 2 diabetes mellitus with hyperglycemia: Secondary | ICD-10-CM | POA: Insufficient documentation

## 2020-08-28 LAB — BASIC METABOLIC PANEL
Anion gap: 9 (ref 5–15)
BUN: 12 mg/dL (ref 6–20)
CO2: 25 mmol/L (ref 22–32)
Calcium: 9 mg/dL (ref 8.9–10.3)
Chloride: 101 mmol/L (ref 98–111)
Creatinine, Ser: 0.8 mg/dL (ref 0.61–1.24)
GFR, Estimated: >60 mL/min (ref >60)
Glucose, Bld: 327 mg/dL — ABNORMAL HIGH (ref 70–99)
Potassium: 4.1 mmol/L (ref 3.5–5.1)
Sodium: 135 mmol/L (ref 135–145)

## 2020-08-28 LAB — LIPID PANEL
Cholesterol: 212 mg/dL — ABNORMAL HIGH (ref 0–200)
HDL: 41 mg/dL (ref >40)
LDL Cholesterol: 120 mg/dL — ABNORMAL HIGH (ref 0–99)
Total CHOL/HDL Ratio: 5.2 RATIO
Triglycerides: 254 mg/dL — ABNORMAL HIGH (ref <150)
VLDL: 51 mg/dL — ABNORMAL HIGH (ref 0–40)

## 2020-08-28 LAB — CBC
HCT: 39.5 % (ref 39.0–52.0)
Hemoglobin: 13 g/dL (ref 13.0–17.0)
MCH: 28.5 pg (ref 26.0–34.0)
MCHC: 32.9 g/dL (ref 30.0–36.0)
MCV: 86.6 fL (ref 80.0–100.0)
Platelets: 263 10*3/uL (ref 150–400)
RBC: 4.56 MIL/uL (ref 4.22–5.81)
RDW: 13.6 % (ref 11.5–15.5)
WBC: 8.3 10*3/uL (ref 4.0–10.5)
nRBC: 0 % (ref 0.0–0.2)

## 2020-08-28 LAB — GLUCOSE, CAPILLARY: Glucose-Capillary: 319 mg/dL — ABNORMAL HIGH (ref 70–99)

## 2020-08-28 LAB — TSH: TSH: 3.48 u[IU]/mL (ref 0.350–4.500)

## 2020-08-28 LAB — TROPONIN I (HIGH SENSITIVITY)
Troponin I (High Sensitivity): 7 ng/L (ref <18)
Troponin I (High Sensitivity): 7 ng/L (ref <18)

## 2020-08-28 LAB — HEMOGLOBIN A1C
Hgb A1c MFr Bld: 11.2 % — ABNORMAL HIGH (ref 4.8–5.6)
Mean Plasma Glucose: 274.74 mg/dL

## 2020-08-28 LAB — SARS CORONAVIRUS 2 (TAT 6-24 HRS): SARS Coronavirus 2: NEGATIVE

## 2020-08-28 MED ORDER — ADULT MULTIVITAMIN W/MINERALS CH
1.0000 | ORAL_TABLET | Freq: Every day | ORAL | Status: DC
  Filled 2020-08-28: qty 1

## 2020-08-28 MED ORDER — FLUOROMETHOLONE 0.1 % OP SUSP
1.0000 [drp] | Freq: Every morning | OPHTHALMIC | Status: DC
  Administered 2020-08-29: 1 [drp] via OPHTHALMIC
  Filled 2020-08-28: qty 5

## 2020-08-28 MED ORDER — LIDOCAINE 5 % EX PTCH: 1.0000 | MEDICATED_PATCH | CUTANEOUS | Status: DC

## 2020-08-28 MED ORDER — TRIAMCINOLONE ACETONIDE 0.1 % EX CREA
1.0000 "application " | TOPICAL_CREAM | Freq: Two times a day (BID) | CUTANEOUS | Status: DC | PRN
  Filled 2020-08-28: qty 15

## 2020-08-28 MED ORDER — HYDROCHLOROTHIAZIDE 12.5 MG PO CAPS
12.5000 mg | ORAL_CAPSULE | Freq: Every day | ORAL | Status: DC
  Administered 2020-08-28 – 2020-08-29 (×2): 12.5 mg via ORAL
  Filled 2020-08-28 (×2): qty 1

## 2020-08-28 MED ORDER — LISINOPRIL 10 MG PO TABS
10.0000 mg | ORAL_TABLET | Freq: Every day | ORAL | Status: DC
  Administered 2020-08-29: 10 mg via ORAL
  Filled 2020-08-28: qty 1

## 2020-08-28 MED ORDER — ONDANSETRON HCL 4 MG/2ML IJ SOLN: 4.0000 mg | Freq: Four times a day (QID) | INTRAMUSCULAR | Status: DC | PRN

## 2020-08-28 MED ORDER — METFORMIN HCL 500 MG PO TABS
500.0000 mg | ORAL_TABLET | Freq: Two times a day (BID) | ORAL | Status: DC
  Administered 2020-08-29: 500 mg via ORAL
  Filled 2020-08-28: qty 1

## 2020-08-28 MED ORDER — GLIMEPIRIDE 2 MG PO TABS
2.0000 mg | ORAL_TABLET | Freq: Every day | ORAL | Status: DC
  Administered 2020-08-29: 2 mg via ORAL
  Filled 2020-08-28: qty 1

## 2020-08-28 MED ORDER — INSULIN ASPART 100 UNIT/ML ~~LOC~~ SOLN
0.0000 [IU] | Freq: Three times a day (TID) | SUBCUTANEOUS | Status: DC
  Administered 2020-08-29: 8 [IU] via SUBCUTANEOUS
  Administered 2020-08-29: 5 [IU] via SUBCUTANEOUS

## 2020-08-28 NOTE — ED Provider Notes (Signed)
I provided a substantive portion of the care of this patient.  I personally performed the entirety of the medical decision making for this encounter.  EKG Interpretation  Date/Time:  Thursday August 28 2020 11:18:47 EST Ventricular Rate:  75 PR Interval:  168 QRS Duration: 86 QT Interval:  394 QTC Calculation: 439 R Axis:   35 Text Interpretation: Normal sinus rhythm Normal ECG Confirmed by Lorre Nick ([id number]) on 08/28/2020 3:3:56 PM  45 year old obese male presents with 3 days of chest pain.  Is been exertional in nature and does radiate to his left arm.  For troponin negative here.  EKG shows no sign of acute ischemia.  Will admit to the hospital service   Lorre Nick, MD 08/28/20 1736

## 2020-08-28 NOTE — H&P (Signed)
History and Physical    Scott Franklin CBJ:[id number] DOB: 02-14-1976 DOA: 08/28/2020  PCP: Patient, No Pcp Per (Confirm with patient/family/NH records and if not entered, this has to be entered at [facility] point of entry) Patient coming from: Home  I have personally briefly reviewed patient's old medical records in [facility]  Chief Complaint: Chest pain  HPI: Scott Franklin is a 45 y.o. male with medical history significant of morbid obesity, history of hypopituitarism off medications since 2020, OSA, family history of hypertrophic cardiomyopathy, presented with new onset of chest pains.  First episode happened 4 days ago, patient was lifting something heavy and then "twitched one muscle in upper chest".  The pain subsided based on, not associated with other symptoms started shortness of breath, nausea vomiting sweating.  Over the last 2 days, has been having similar pain every time he tried to lift the arm.  However today, while doing shopping patient started to feel tightness in the chest which is different from the previous episode and the pain radiated to the left upper arm, associated with SOB and sweaty.  Episode last 10 to 20 minutes, no exacerbation or relieving factors.  Patient father died from sudden cardiac death secondary to hypertrophic cardiomyopathy at age of 35.  At that time (>10 years ago), patient and his brother underwent hypertrophic cardiomyopathy screening with echocardiogram which showed normal thickness of ventricle walls.  However patient never followed to have another repeat echo since.  ED Course: EKG negative for ST changes, troponin negative x2.  Blood pressure elevated, blood work showed glucose >300.  Review of Systems: As per HPI otherwise 14 point review of systems negative.    Past Medical History:  Diagnosis Date   Anemia    Cellulitis    Depression    Diabetes mellitus without complication (HCC)    ED (erectile dysfunction)     Hypertension    Obesity    Other testicular hypofunction    Panhypopituitarism (HCC)    Sebaceous cyst    Testosterone deficiency     History reviewed. No pertinent surgical history.   reports that he quit smoking about 23 years ago. His smoking use included cigarettes. He has a 1.50 pack-year smoking history. He has never used smokeless tobacco. He reports that he does not drink alcohol and does not use drugs.  Allergies  Allergen Reactions   Amoxicillin Anaphylaxis, Swelling and Other (See Comments)    Throat swells   Other Itching, Rash and Other (See Comments)    Perfumes and dyes from soap Eyes burn and itch with use of ophthalmic solutions containing preservatives   Soap Rash and Other (See Comments)    Sensitive to soap and detergent   Corn Oil Other (See Comments)    Pt reports experiencing emotional distress in response to exposure   Corn Starch Other (See Comments)    Pt reports experiencing emotional distress in response to exposure    Family History  Problem Relation Age of Onset   Hypertension Father    Heart attack Father    Hypertrophic cardiomyopathy Father    Healthy Mother    Diabetes Neg Hx      Prior to Admission medications   Medication Sig Start Date End Date Taking? Authorizing Provider  fluorometholone (FML) 0.1 % ophthalmic suspension Place 1 drop into the left eye in the morning.  10/21/19  Yes [provider]  multivitamin (ONE-A-DAY MEN'S) TABS tablet Take 1 tablet by mouth daily with breakfast.  Yes [provider]  triamcinolone (KENALOG) 0.1 % Apply 1 application topically 2 (two) times daily as needed (flares). 08/01/20  Yes [provider]  hydrocortisone (ANUSOL-HC) 25 MG suppository Place 1 suppository (25 mg total) rectally 2 (two) times daily. Patient not taking: No sig reported 10/22/19   Merrilee Jansky, MD    Physical Exam: Vitals:   08/28/20 1630 08/28/20 1645 08/28/20 1700 08/28/20 1745   BP: 122/70 112/68 121/71 139/79  Pulse: 74 73 79 77  Resp: 19 16 16 16   Temp:      TempSrc:      SpO2: 96% 96% 95% 96%  Weight:      Height:        Constitutional: NAD, calm, comfortable Vitals:   08/28/20 1630 08/28/20 1645 08/28/20 1700 08/28/20 1745  BP: 122/70 112/68 121/71 139/79  Pulse: 74 73 79 77  Resp: 19 16 16 16   Temp:      TempSrc:      SpO2: 96% 96% 95% 96%  Weight:      Height:       Eyes: PERRL, lids and conjunctivae normal ENMT: Mucous membranes are moist. Posterior pharynx clear of any exudate or lesions.Normal dentition.  Neck: normal, supple, no masses, no thyromegaly Respiratory: clear to auscultation bilaterally, no wheezing, no crackles. Normal respiratory effort. No accessory muscle use.  Cardiovascular: Regular rate and rhythm, no murmurs / rubs / gallops. No extremity edema. 2+ pedal pulses. No carotid bruits.  Abdomen: no tenderness, no masses palpated. No hepatosplenomegaly. Bowel sounds positive.  Musculoskeletal: no clubbing / cyanosis. No joint deformity upper and lower extremities. Good ROM, no contractures. Normal muscle tone.  Skin: no rashes, lesions, ulcers. No induration Neurologic: CN 2-12 grossly intact. Sensation intact, DTR normal. Strength 5/5 in all 4.  Psychiatric: Normal judgment and insight. Alert and oriented x 3. Normal mood.     Labs on Admission: I have personally reviewed following labs and imaging studies  CBC: Recent Labs  Lab 08/28/20 1130  WBC 8.3  HGB 13.0  HCT 39.5  MCV 86.6  PLT 263   Basic Metabolic Panel: Recent Labs  Lab 08/28/20 1130  NA 135  K 4.1  CL 101  CO2 25  GLUCOSE 327*  BUN 12  CREATININE 0.80  CALCIUM 9.0   GFR: Estimated Creatinine Clearance: 206.8 mL/min (by C-G formula based on SCr of 0.8 mg/dL). Liver Function Tests: No results for input(s): AST, ALT, ALKPHOS, BILITOT, PROT, ALBUMIN in the last 168 hours. No results for input(s): LIPASE, AMYLASE in the last 168 hours. No  results for input(s): AMMONIA in the last 168 hours. Coagulation Profile: No results for input(s): INR, PROTIME in the last 168 hours. Cardiac Enzymes: No results for input(s): CKTOTAL, CKMB, CKMBINDEX, TROPONINI in the last 168 hours. BNP (last 3 results) No results for input(s): PROBNP in the last 8760 hours. HbA1C: No results for input(s): HGBA1C in the last 72 hours. CBG: No results for input(s): GLUCAP in the last 168 hours. Lipid Profile: No results for input(s): CHOL, HDL, LDLCALC, TRIG, CHOLHDL, LDLDIRECT in the last 72 hours. Thyroid Function Tests: No results for input(s): TSH, T4TOTAL, FREET4, T3FREE, THYROIDAB in the last 72 hours. Anemia Panel: No results for input(s): VITAMINB12, FOLATE, FERRITIN, TIBC, IRON, RETICCTPCT in the last 72 hours. Urine analysis:    Component Value Date/Time   COLORURINE YELLOW 04/06/2012 1020   APPEARANCEUR CLEAR 04/06/2012 1020   LABSPEC 1.025 04/06/2012 1020   PHURINE 6.0 04/06/2012 1020  GLUCOSEU NEGATIVE 04/06/2012 1020   HGBUR TRACE-INTACT 04/06/2012 1020   HGBUR trace-lysed 04/03/2007 1032   BILIRUBINUR NEGATIVE 04/06/2012 1020   KETONESUR NEGATIVE 04/06/2012 1020   PROTEINUR 100 (A) 11/16/2010 1006   UROBILINOGEN 0.2 04/06/2012 1020   NITRITE NEGATIVE 04/06/2012 1020   LEUKOCYTESUR NEGATIVE 04/06/2012 1020    Radiological Exams on Admission: DG Chest 2 View  Result Date: 08/28/2020 CLINICAL DATA:  Shortness of breath and chest pain EXAM: CHEST - 2 VIEW COMPARISON:  January 29, 2016 FINDINGS: Lungs are clear. Heart is mildly enlarged with pulmonary vascularity normal. No adenopathy. No bone lesions. No pneumothorax. IMPRESSION: Mild cardiac enlargement.  Lungs clear. Electronically Signed   By: Bretta Bang III M.D.   On: 08/28/2020 12:23    EKG: Independently reviewed.  Sinus, no acute ST changes  Assessment/Plan Active Problems:   Chest pain  (please populate well all problems here in Problem List. (For example, if  patient is on BP meds at home and you resume or decide to hold them, it is a problem that needs to be her. Same for CAD, COPD, HLD and so on)  Chest pain, ACS ruled out -Given the strong family history of hypertrophic cardiomyopathy and sudden death at young age, ordered echocardiogram to look for evidence of asymmetrical thickening or obstruction.  Depends on result, will consider CCB versus beta-blocker. -If the echo screen negative, recommend follow-up outpatient cardiology for stress test.  Explained the plan to patient and his mother at bedside, both understood and agreed.  New onset diabetes -Start Amaryl and Metformin -Check A1c and lipid panel.  Accelerated blood pressure -Start ACE inhibitor and HCTZ -Consider beta-blocker depends on echo result.  OSA -CPAP at bedtime  Morbid obesity -Suspect metabolic syndrome. -Consider outpatient bariatric evaluation  DVT prophylaxis: *SCD Code Status: Full  Family Communication: Mother at bedside Disposition Plan: Expect less than 2 midnight hospital stay, likely can go home tomorrow after cardiac work-up. Consults called: None Admission status: Tele obs   Emeline General MD [facility] Pager [phone]  08/28/2020, 6:33 PM

## 2020-08-28 NOTE — ED Triage Notes (Signed)
Pt here POV with reports of chest pain onset 3 days ago. Pain in central and left sided with radiation to the L arm. Pt also endorses some shob. Pt states the pain has been worsening over the last few days.

## 2020-08-28 NOTE — ED Notes (Signed)
This RN attempted to swab pt for covid for admission, The pt stated "he does not have covid and does not want to be swab" This RN tried to explain it is protocol for admission to protect staff and other patients. The patient then stated "is there any other test then going up his nose" and he stated give him a couple of mins he is going to make a phone call cause now pt does not want to stay if he has to be covid swab

## 2020-08-28 NOTE — Progress Notes (Signed)
RT set-up CPAP 15cmH2O (home settings) via L [facility].  RT fitted patient for the CPAP mask and patient demonstrated that he was able to place mask on.  Advised if he needed any assistance to have RN give respiratory a call.

## 2020-08-28 NOTE — ED Provider Notes (Signed)
[facility] EMERGENCY DEPARTMENT Provider Note   CSN: [id number] Arrival date & time: 08/28/20  1103     History Chief Complaint  Patient presents with  . Chest Pain    Scott Franklin is a 45 y.o. male w PMHx T2DM, HTN, testosterone deficiency, OSA w CPAP, presenting to the ED with complaint of chest pain that began prior to arrival. Patient states he was walking around the grocery store when he began feeling a "gripping" left sided chest pain that radiating around to his back and down his left arm. He states his symptoms began to improve as he was waiting here in the waiting room. He had an episode of dizziness while waiting for his chest xray. He denies associated SOB, nausea or diaphoresis.  States he has not seen a primary care doctor in a couple of years. He has documented hx of DM and HTN, though states he is unaware of any hx of such. He denies new LE swelling, or fever. No infectious symptoms. No known cardiac hx or smoking hx.  Reports prior injury to intercostal muscles 5 years back after a fall, has had some intermittent pains related to that though his symptoms today feel different.   The history is provided by the patient.       Past Medical History:  Diagnosis Date  . Anemia   . Cellulitis   . Depression   . Diabetes mellitus without complication (HCC)   . ED (erectile dysfunction)   . Hypertension   . Obesity   . Other testicular hypofunction   . Panhypopituitarism (HCC)   . Sebaceous cyst   . Testosterone deficiency     Patient Active Problem List   Diagnosis Date Noted  . Anemia 04/06/2012  . Routine general medical examination at a health care facility 04/06/2012  . OSA on CPAP 12/21/2010  . Hypogonadism male 04/20/2007  . Obesity, morbid (HCC) 04/10/2007    History reviewed. No pertinent surgical history.     Family History  Problem Relation Age of Onset  . Hypertension Father   . Heart attack Father   . Hypertrophic  cardiomyopathy Father   . Healthy Mother   . Diabetes Neg Hx     Social History   Tobacco Use  . Smoking status: Former Smoker    Packs/day: 0.50    Years: 3.00    Pack years: 1.50    Types: Cigarettes    Quit date: 06/21/1997    Years since quitting: 23.2  . Smokeless tobacco: Never Used  Substance Use Topics  . Alcohol use: No  . Drug use: No    Home Medications Prior to Admission medications   Medication Sig Start Date End Date Taking? Authorizing Provider  fluorometholone (FML) 0.1 % ophthalmic suspension Place 1 drop into the left eye in the morning.  10/21/19  Yes [provider]  multivitamin (ONE-A-DAY MEN'S) TABS tablet Take 1 tablet by mouth daily with breakfast.   Yes [provider]  triamcinolone (KENALOG) 0.1 % Apply 1 application topically 2 (two) times daily as needed (flares). 08/01/20  Yes [provider]  hydrocortisone (ANUSOL-HC) 25 MG suppository Place 1 suppository (25 mg total) rectally 2 (two) times daily. Patient not taking: No sig reported 10/22/19   Merrilee Jansky, MD    Allergies    Amoxicillin, Other, Soap, Corn oil, and Corn starch  Review of Systems   Review of Systems  Cardiovascular: Positive for chest pain. Negative for leg swelling.  Neurological: Positive for dizziness.  All other systems reviewed and are negative.   Physical Exam Updated Vital Signs BP 139/79   Pulse 77   Temp 98.1 F (36.7 C) (Oral)   Resp 16   Ht 5\' 9"  (1.753 m)   Wt (!) 204.1 kg   SpO2 96%   BMI 66.45 kg/m   Physical Exam Vitals and nursing note reviewed.  Constitutional:      General: He is not in acute distress.    Appearance: He is well-developed.     Comments: Morbidly obese  HENT:     Head: Normocephalic and atraumatic.  Eyes:     Conjunctiva/sclera: Conjunctivae normal.  Cardiovascular:     Rate and Rhythm: Normal rate and regular rhythm.     Pulses: Normal pulses.  Pulmonary:     Effort: Pulmonary effort is  normal. No respiratory distress.     Breath sounds: Normal breath sounds.  Abdominal:     Palpations: Abdomen is soft.  Musculoskeletal:     Comments: Left calf is slightly larger than the right, nontender  Skin:    General: Skin is warm.  Neurological:     Mental Status: He is alert.  Psychiatric:        Behavior: Behavior normal.     ED Results / Procedures / Treatments   Labs (all labs ordered are listed, but only abnormal results are displayed) Labs Reviewed  BASIC METABOLIC PANEL - Abnormal; Notable for the following components:      Result Value   Glucose, Bld 327 (*)    All other components within normal limits  SARS CORONAVIRUS 2 (TAT 6-24 HRS)  CBC  RAPID URINE DRUG SCREEN, HOSP PERFORMED  TROPONIN I (HIGH SENSITIVITY)  TROPONIN I (HIGH SENSITIVITY)    EKG EKG Interpretation  Date/Time:  Thursday August 28 2020 11:18:47 EST Ventricular Rate:  75 PR Interval:  168 QRS Duration: 86 QT Interval:  394 QTC Calculation: 439 R Axis:   35 Text Interpretation: Normal sinus rhythm Normal ECG Confirmed by 07-08-1995 (Lorre Nick) on 08/28/2020 3:51:00 PM   Radiology DG Chest 2 View  Result Date: 08/28/2020 CLINICAL DATA:  Shortness of breath and chest pain EXAM: CHEST - 2 VIEW COMPARISON:  January 29, 2016 FINDINGS: Lungs are clear. Heart is mildly enlarged with pulmonary vascularity normal. No adenopathy. No bone lesions. No pneumothorax. IMPRESSION: Mild cardiac enlargement.  Lungs clear. Electronically Signed   By: January 31, 2016 III M.D.   On: 08/28/2020 12:23    Procedures Procedures   Medications Ordered in ED Medications - No data to display  ED Course  I have reviewed the triage vital signs and the nursing notes.  Pertinent labs & imaging results that were available during my care of the patient were reviewed by me and considered in my medical decision making (see chart for details).    MDM Rules/Calculators/A&P                          Patient is  presenting with exertional chest pain that began today, better with rest.  Pain radiates to the left arm and back.  He is hypertensive on arrival with documented history of hypertension.  His initial troponin is within normal limits, EKG is nonischemic.  He is not having active chest pain in the ED, this improved while waiting for an acute bed.  Considering his risk factors of morbid obesity, history of high blood pressure, and documented history  of diabetes with CBG in the 300s here, believe he would benefit from medical admission for more urgent cardiac work-up as he does not have PCP.  Patient is in agreement with plan for admission.  Dr. Chipper Herb with [facility] accepting admission.  Final Clinical Impression(s) / ED Diagnoses Final diagnoses:  Precordial chest pain    Rx / DC Orders ED Discharge Orders    None       Emmajean Ratledge, [place] N, PA-C 08/28/20 1754    Lorre Nick, MD 09/01/20 1406

## 2020-08-29 ENCOUNTER — Other Ambulatory Visit: Payer: Self-pay | Admitting: Internal Medicine

## 2020-08-29 ENCOUNTER — Observation Stay (HOSPITAL_BASED_OUTPATIENT_CLINIC_OR_DEPARTMENT_OTHER): Payer: BC Managed Care – PPO

## 2020-08-29 DIAGNOSIS — R079 Chest pain, unspecified: Secondary | ICD-10-CM

## 2020-08-29 DIAGNOSIS — R072 Precordial pain: Secondary | ICD-10-CM | POA: Diagnosis not present

## 2020-08-29 LAB — RAPID URINE DRUG SCREEN, HOSP PERFORMED
Amphetamines: NOT DETECTED
Barbiturates: NOT DETECTED
Benzodiazepines: NOT DETECTED
Cocaine: NOT DETECTED
Opiates: NOT DETECTED
Tetrahydrocannabinol: NOT DETECTED

## 2020-08-29 LAB — ECHOCARDIOGRAM COMPLETE
AR max vel: 3.58 cm2
AV Area VTI: 3.44 cm2
AV Area mean vel: 3.83 cm2
AV Mean grad: 1 mmHg
AV Peak grad: 2.8 mmHg
Ao pk vel: 0.84 m/s
Area-P 1/2: 4.31 cm2
Height: 69 in
MV VTI: 2.42 cm2
S' Lateral: 3.5 cm
Weight: 7385.6 oz

## 2020-08-29 LAB — GLUCOSE, CAPILLARY
Glucose-Capillary: 281 mg/dL — ABNORMAL HIGH (ref 70–99)
Glucose-Capillary: 245 mg/dL — ABNORMAL HIGH (ref 70–99)

## 2020-08-29 MED ORDER — METFORMIN HCL 500 MG PO TABS: 500.0000 mg | ORAL_TABLET | Freq: Two times a day (BID) | ORAL | 0 refills | Status: DC

## 2020-08-29 MED ORDER — LISINOPRIL 10 MG PO TABS: 10.0000 mg | ORAL_TABLET | Freq: Every day | ORAL | 0 refills | Status: DC

## 2020-08-29 MED ORDER — INSULIN PEN NEEDLE 31G X 5 MM MISC: 3.0000 [IU] | Freq: Three times a day (TID) | 0 refills | Status: AC

## 2020-08-29 MED ORDER — BLOOD GLUCOSE METER KIT: PACK | 0 refills | Status: DC

## 2020-08-29 MED ORDER — HYDROCHLOROTHIAZIDE 12.5 MG PO CAPS: 12.5000 mg | ORAL_CAPSULE | Freq: Every day | ORAL | 0 refills | Status: DC

## 2020-08-29 MED ORDER — ONE-A-DAY MENS PO TABS: 1.0000 | ORAL_TABLET | Freq: Every day | ORAL | 0 refills | Status: AC

## 2020-08-29 MED ORDER — ATORVASTATIN CALCIUM 80 MG PO TABS: 80.0000 mg | ORAL_TABLET | Freq: Every day | ORAL | 0 refills | Status: DC

## 2020-08-29 MED ORDER — BLOOD GLUCOSE MONITOR KIT: PACK | 0 refills | Status: DC

## 2020-08-29 MED ORDER — LIVING WELL WITH DIABETES BOOK
Freq: Once | Status: AC
  Filled 2020-08-29: qty 1

## 2020-08-29 MED ORDER — INSULIN DETEMIR 100 UNIT/ML FLEXPEN: 10.0000 [IU] | PEN_INJECTOR | Freq: Every day | SUBCUTANEOUS | 0 refills | Status: DC

## 2020-08-29 MED ORDER — INSULIN ASPART 100 UNIT/ML FLEXPEN: PEN_INJECTOR | SUBCUTANEOUS | 0 refills | Status: DC

## 2020-08-29 MED ORDER — INSULIN STARTER KIT- PEN NEEDLES (ENGLISH)
1.0000 | Freq: Once | Status: AC
  Administered 2020-08-29: 1
  Filled 2020-08-29: qty 1

## 2020-08-29 MED ORDER — ATORVASTATIN CALCIUM 80 MG PO TABS
80.0000 mg | ORAL_TABLET | Freq: Every day | ORAL | 0 refills | Status: DC
Start: 2020-08-30 — End: 2020-08-29

## 2020-08-29 MED ORDER — GLIMEPIRIDE 2 MG PO TABS: 2.0000 mg | ORAL_TABLET | Freq: Every day | ORAL | 0 refills | Status: DC

## 2020-08-29 MED ORDER — INSULIN STARTER KIT- PEN NEEDLES (ENGLISH): 1.0000 | Freq: Once | 0 refills | Status: DC

## 2020-08-29 MED ORDER — PERFLUTREN LIPID MICROSPHERE
1.0000 mL | INTRAVENOUS | Status: AC | PRN
  Administered 2020-08-29: 5 mL via INTRAVENOUS
  Filled 2020-08-29: qty 10

## 2020-08-29 MED ORDER — INSULIN STARTER KIT- PEN NEEDLES (ENGLISH): 1.0000 | Freq: Once | 0 refills | Status: AC

## 2020-08-29 MED ORDER — ATORVASTATIN CALCIUM 80 MG PO TABS
80.0000 mg | ORAL_TABLET | Freq: Every day | ORAL | Status: DC
  Administered 2020-08-29: 80 mg via ORAL
  Filled 2020-08-29: qty 1

## 2020-08-29 NOTE — Discharge Summary (Signed)
Discharge Summary  [name]:[id number] DOB: 1976-05-15  PCP: Patient, No Pcp Per  Admit date: 08/28/2020 Discharge date: 08/29/2020  Time spent: 35 minutes   Recommendations for Outpatient Follow-up:  1. Follow up with PCP in 1-2 weeks. 2. Follow up with cardiology in 4-6 weeks.  Discharge Diagnoses:  Active Hospital Problems   Diagnosis Date Noted  . Precordial chest pain 08/28/2020    Resolved Hospital Problems  No resolved problems to display.    Discharge Condition: Stable   Diet recommendation: Heart healthy carb modified diet.  Vitals:   08/29/20 0815 08/29/20 1156  BP: 137/80 136/78  Pulse: 77 72  Resp: 17 18  Temp: 98.1 F (36.7 C) 97.9 F (36.6 C)  SpO2: 96% 94%    History of present illness:  [name] is a 45 year old male with PMHx of OSA on CPAP, obesity, hypopituitarism, and family history of hypertrophic cardiomyopathy presenting with new onset chest pain.  He has been having intermittent musculoskeletal chest pains over the past week. However, yesterday evening he noted tightness in chest that was worsening with some radiation to left upper arm. Episode lasted ~10-20 minutes and spontaneously resolved. No further chest pain at this time.  EKG without any ST segment changes. Troponins negative x2. Echo pending.  08/29/20: Seen and examined at his bedside this AM.  Reports reproducible left sided chest wall pain.  Seen by cardiology.  LDL not at goal 120, A1c 11.2.  Cardiology recommended Lipitor 80 mg daily, glimepiride 2 mg daily, HCTZ 12.5 mg daily, lisinopril 10 mg daily, Metformin, 500 mg twice daily.  Ok to discharge from a cardiology standpoint.  Hospital Course:  Active Problems:   Precordial chest pain  Atypical chest pain Presented with left-sided chest pain for 4 days duration that started with activity initially with radiation to the left arm. Troponin negative x2 Twelve-lead EKG nonspecific ST-T changes. 2D echo has  been ordered and is pending. Seen by cardiology, okay to discharge from a cardiac standpoint. Will need to follow-up with cardiology in 4 to 6 weeks.  Severe morbid obesity BMI 68 Recommend weight loss outpatient with regular physical activity and healthy dieting  Newly diagnosed type II with hyperglycemia Hemoglobin A1c 11.2 on 45/03/2011. Started on Metformin 500 mg twice daily, glimepiride 2 mg daily. Continue insulin sliding scale Follow with your PCP within a week.  Hyperlipidemia LDL 120, goal less than 70. Started on Lipitor 80 mg daily  Essential hypertension  BP is at goal Continue HCTZ 12.5 mg daily and lisinopril 10 mg daily.      Procedures:  2D echo  Consultations:  Cardiology.  Discharge Exam: BP 136/78 (BP Location: Right Arm)   Pulse 72   Temp 97.9 F (36.6 C) (Oral)   Resp 18   Ht _0  (1.753 m)   Wt (!) 209.4 kg   SpO2 94%   BMI 68.17 kg/m  . General: 45 y.o. year-old male well developed well nourished in no acute distress.  Alert and oriented x3. . Cardiovascular: Regular rate and rhythm with no rubs or gallops.  No thyromegaly or JVD noted.   Marland Kitchen Respiratory: Clear to auscultation with no wheezes or rales. Good inspiratory effort. . Abdomen: Soft nontender nondistended with normal bowel sounds x4 quadrants. . Musculoskeletal: No lower extremity edema. 2/4 pulses in all 4 extremities. . Skin: No ulcerative lesions noted or rashes, . Psychiatry: Mood is appropriate for condition and setting  Discharge Instructions You were cared for  by a hospitalist during your hospital stay. If you have any questions about your discharge medications or the care you received while you were in the hospital after you are discharged, you can call the unit and asked to speak with the hospitalist on call if the hospitalist that took care of you is not available. Once you are discharged, your primary care physician will handle any further medical issues. Please note  that NO REFILLS for any discharge medications will be authorized once you are discharged, as it is imperative that you return to your primary care physician (or establish a relationship with a primary care physician if you do not have one) for your aftercare needs so that they can reassess your need for medications and monitor your lab values.   Allergies as of 08/29/2020      Reactions   Amoxicillin Anaphylaxis, Swelling, Other (See Comments)   Throat swells   Other Itching, Rash, Other (See Comments)   Perfumes and dyes from soap Eyes burn and itch with use of ophthalmic solutions containing preservatives   Soap Rash, Other (See Comments)   Sensitive to soap and detergent   Corn Oil Other (See Comments)   Pt reports experiencing emotional distress in response to exposure   Corn Starch Other (See Comments)   Pt reports experiencing emotional distress in response to exposure      Medication List    STOP taking these medications   hydrocortisone 25 MG suppository Commonly known as: ANUSOL-HC     TAKE these medications   atorvastatin 80 MG tablet Commonly known as: LIPITOR Take 1 tablet (80 mg total) by mouth daily. Start taking on: August 30, 2020   blood glucose meter kit and supplies Kit Dispense based on patient and insurance preference. Use up to four times daily as directed.   fluorometholone 0.1 % ophthalmic suspension Commonly known as: FML Place 1 drop into the left eye in the morning.   glimepiride 2 MG tablet Commonly known as: AMARYL Take 1 tablet (2 mg total) by mouth daily with breakfast. Start taking on: August 30, 2020   hydrochlorothiazide 12.5 MG capsule Commonly known as: MICROZIDE Take 1 capsule (12.5 mg total) by mouth daily. Start taking on: August 30, 2020   insulin aspart 100 UNIT/ML FlexPen Commonly known as: NOVOLOG CBG 70 - 120: 0 units  CBG 121 - 150: 2 units  CBG 151 - 200: 3 units  CBG 201 - 250: 5 units  CBG 251 - 300: 8 units  CBG 301 -  350: 11 units  CBG 351 - 400: 15 units  CBG > 400: call MD and obtain STAT lab verification   insulin starter kit- pen needles Misc 1 kit by Other route once for 1 dose.   lisinopril 10 MG tablet Commonly known as: ZESTRIL Take 1 tablet (10 mg total) by mouth daily. Start taking on: August 30, 2020   metFORMIN 500 MG tablet Commonly known as: GLUCOPHAGE Take 1 tablet (500 mg total) by mouth 2 (two) times daily with a meal.   multivitamin Tabs tablet Take 1 tablet by mouth daily with breakfast.   triamcinolone 0.1 % Commonly known as: KENALOG Apply 1 application topically 2 (two) times daily as needed (flares).      Allergies  Allergen Reactions  . Amoxicillin Anaphylaxis, Swelling and Other (See Comments)    Throat swells  . Other Itching, Rash and Other (See Comments)    Perfumes and dyes from soap Eyes burn and itch  with use of ophthalmic solutions containing preservatives  . Soap Rash and Other (See Comments)    Sensitive to soap and detergent  . Corn Oil Other (See Comments)    Pt reports experiencing emotional distress in response to exposure  . Corn Starch Other (See Comments)    Pt reports experiencing emotional distress in response to exposure      The results of significant diagnostics from this hospitalization (including imaging, microbiology, ancillary and laboratory) are listed below for reference.    Significant Diagnostic Studies: DG Chest 2 View  Result Date: 08/28/2020 CLINICAL DATA:  Shortness of breath and chest pain EXAM: CHEST - 2 VIEW COMPARISON:  January 29, 2016 FINDINGS: Lungs are clear. Heart is mildly enlarged with pulmonary vascularity normal. No adenopathy. No bone lesions. No pneumothorax. IMPRESSION: Mild cardiac enlargement.  Lungs clear. Electronically Signed   By: Lowella Grip III M.D.   On: 08/28/2020 12:23    Microbiology: Recent Results (from the past 240 hour(s))  SARS CORONAVIRUS 2 (TAT 6-24 HRS) Nasopharyngeal  Nasopharyngeal Swab     Status: None   Collection Time: 08/28/20  5:24 PM   Specimen: Nasopharyngeal Swab  Result Value Ref Range Status   SARS Coronavirus 2 NEGATIVE NEGATIVE Final    Comment: (NOTE) SARS-CoV-2 target nucleic acids are NOT DETECTED.  The SARS-CoV-2 RNA is generally detectable in upper and lower respiratory specimens during the acute phase of infection. Negative results do not preclude SARS-CoV-2 infection, do not rule out co-infections with other pathogens, and should not be used as the sole basis for treatment or other patient management decisions. Negative results must be combined with clinical observations, patient history, and epidemiological information. The expected result is Negative.  Fact Sheet for Patients: SugarRoll.be  Fact Sheet for Healthcare Providers: https://www.woods-mathews.com/  This test is not yet approved or cleared by the [place] FDA and  has been authorized for detection and/or diagnosis of SARS-CoV-2 by FDA under an Emergency Use Authorization (EUA). This EUA will remain  in effect (meaning this test can be used) for the duration of the COVID-19 declaration under Se ction 564(b)(1) of the Act, 21 U.S.C. section 360bbb-3(b)(1), unless the authorization is terminated or revoked sooner.  Performed at [facility], [place] [address] [place], [place] [address]      Labs: Basic Metabolic Panel: Recent Labs  Lab 08/28/20 1130  NA 135  K 4.1  CL 101  CO2 25  GLUCOSE 327*  BUN 12  CREATININE 0.80  CALCIUM 9.0   Liver Function Tests: No results for input(s): AST, ALT, ALKPHOS, BILITOT, PROT, ALBUMIN in the last 168 hours. No results for input(s): LIPASE, AMYLASE in the last 168 hours. No results for input(s): AMMONIA in the last 168 hours. CBC: Recent Labs  Lab 08/28/20 1130  WBC 8.3  HGB 13.0  HCT 39.5  MCV 86.6  PLT 263   Cardiac Enzymes: No results for input(s):  CKTOTAL, CKMB, CKMBINDEX, TROPONINI in the last 168 hours. BNP: BNP (last 3 results) No results for input(s): BNP in the last 8760 hours.  ProBNP (last 3 results) No results for input(s): PROBNP in the last 8760 hours.  CBG: Recent Labs  Lab 08/28/20 2101 08/29/20 0557 08/29/20 1116  GLUCAP 319* 281* 245*       Signed:  Kayleen Memos, MD [facility] 08/29/2020, 12:04 PM

## 2020-08-29 NOTE — Progress Notes (Signed)
  Echocardiogram 2D Echocardiogram has been performed with [facility].  Gerda Diss 08/29/2020, 10:31 AM

## 2020-08-29 NOTE — Discharge Instructions (Signed)
Nonspecific Chest Pain Chest pain can be caused by many different conditions. Some causes of chest pain can be life-threatening. These will require treatment right away. Serious causes of chest pain include:  Heart attack.  A tear in the body's main blood vessel.  Redness and swelling (inflammation) around your heart.  Blood clot in your lungs. Other causes of chest pain may not be so serious. These include:  Heartburn.  Anxiety or stress.  Damage to bones or muscles in your chest.  Lung infections. Chest pain can feel like:  Pain or discomfort in your chest.  Crushing, pressure, aching, or squeezing pain.  Burning or tingling.  Dull or sharp pain that is worse when you move, cough, or take a deep breath.  Pain or discomfort that is also felt in your back, neck, jaw, shoulder, or arm, or pain that spreads to any of these areas. It is hard to know whether your pain is caused by something that is serious or something that is not so serious. So it is important to see your doctor right away if you have chest pain. Follow these instructions at home: Medicines  Take over-the-counter and prescription medicines only as told by your doctor.  If you were prescribed an antibiotic medicine, take it as told by your doctor. Do not stop taking the antibiotic even if you start to feel better. Lifestyle  Rest as told by your doctor.  Do not use any products that contain nicotine or tobacco, such as cigarettes, e-cigarettes, and chewing tobacco. If you need help quitting, ask your doctor.  Do not drink alcohol.  Make lifestyle changes as told by your doctor. These may include: ? Getting regular exercise. Ask your doctor what activities are safe for you. ? Eating a heart-healthy diet. A diet and nutrition specialist (dietitian) can help you to learn healthy eating options. ? Staying at a healthy weight. ? Treating diabetes or high blood pressure, if needed. ? Lowering your stress.  Activities such as yoga and relaxation techniques can help.   General instructions  Pay attention to any changes in your symptoms. Tell your doctor about them or any new symptoms.  Avoid any activities that cause chest pain.  Keep all follow-up visits as told by your doctor. This is important. You may need more testing if your chest pain does not go away. Contact a doctor if:  Your chest pain does not go away.  You feel depressed.  You have a fever. Get help right away if:  Your chest pain is worse.  You have a cough that gets worse, or you cough up blood.  You have very bad (severe) pain in your belly (abdomen).  You pass out (faint).  You have either of these for no clear reason: ? Sudden chest discomfort. ? Sudden discomfort in your arms, back, neck, or jaw.  You have shortness of breath at any time.  You suddenly start to sweat, or your skin gets clammy.  You feel sick to your stomach (nauseous).  You throw up (vomit).  You suddenly feel lightheaded or dizzy.  You feel very weak or tired.  Your heart starts to beat fast, or it feels like it is skipping beats. These symptoms may be an emergency. Do not wait to see if the symptoms will go away. Get medical help right away. Call your local emergency services (911 in the [place] Do not drive yourself to the hospital. Summary  Chest pain can be caused by many different conditions.  The cause may be serious and need treatment right away. If you have chest pain, see your doctor right away.  Follow your doctor's instructions for taking medicines and making lifestyle changes.  Keep all follow-up visits as told by your doctor. This includes visits for any further testing if your chest pain does not go away.  Be sure to know the signs that show that your condition has become worse. Get help right away if you have these symptoms. This information is not intended to replace advice given to you by your health care provider.  Make sure you discuss any questions you have with your health care provider. Document Revised: 12/08/2017 Document Reviewed: 12/08/2017 Elsevier Patient Education  2021 Elsevier Inc.  Local Endocrinologists [facility] [phone]) 1. Dr. Carlus Pavlov 2. Dr. Reather Littler 3. Abby Shamleffer 4. Dr. Campbell [facility] [phone]) 1. Dr. Talmage Coin [facility] [phone]) 1. Dr. Dorisann Frames 2. Dr. Marlene Lard [facility] [phone]) 1. Dr. Deirdre Pippins [facility] [phone]) [[place] office]  [phone]) [[place] office] 1. Dr. Efraim Kaufmann [place] 2. Dr. Verdis Frederickson [facility]) [phone]) 1. Autumn Hudnall Yetta Barre), PA 2. Dr. Izell [place] 3. Dr. Jillyn Ledger. [facility] [phone]) 1. Dr. Marquis Lunch

## 2020-08-29 NOTE — Care Management (Signed)
Spoke to patient on phone regarding PCP.    Explained he can call number on insurance card and be provided a list of providers in network with insurance.   If he knows of a provider he would like to establish care with he can call the office directly.   NCM offered [facility] number if he wants a [facility] Provider.  NCM offered to call [facility] to arrange an appointment.   Patient asked NCM to recommend a MD. NCM explained we cannot recommend , when asked why explained due to policy. Patient stated that was not a reason and he will find provider.    Ronny Flurry RN

## 2020-08-29 NOTE — Progress Notes (Signed)
Inpatient Diabetes Program Recommendations  AACE/ADA: New Consensus Statement on Inpatient Glycemic Control (2015)  Target Ranges:  Prepandial:   less than 140 mg/dL      Peak postprandial:   less than 180 mg/dL (1-2 hours)      Critically ill patients:  140 - 180 mg/dL   Lab Results  Component Value Date   GLUCAP 245 (H) 08/29/2020   HGBA1C 11.2 (H) 08/28/2020    Review of Glycemic Control  Diabetes history: new diagnosis  Current orders for Inpatient glycemic control: Amaryl 2 mg Daily, Metformin 500 mg Daily, Novolog 0-15 units tid + hs  Spoke with patient about new diabetes diagnosis.  Discussed A1C results (11.2%) and explained what an A1C is. Discussed basic pathophysiology of DM Type 2, basic home care, importance of checking CBGs and maintaining good CBG control to prevent long-term and short-term complications. Reviewed glucose and A1C goals.  Reviewed signs and symptoms of hyperglycemia and hypoglycemia along with treatment for both. Discussed impact of nutrition, exercise, stress, sickness, and medications on diabetes control. Reviewed Living Well with diabetes booklet and encouraged patient to read through entire book. Informed patient that he will be prescribed basal insulin, metformin and Amaryl at time of d/c. Discussed common side effects with the medication.  Asked patient to check his glucose 2 times per day (fasting and alternating meal time and hs) and to keep a log book of glucose readings and insulin taken. Explained how the doctor he follows up with can use the log book to continue to make insulin adjustments if needed. Reviewed and demonstrated how to draw up and administer insulin via insulin pen. Patient was able to successfully demonstrate. Patient verbalized understanding of information discussed and he states that he has no further questions at this time related to diabetes.   RNs to provide ongoing basic DM education at bedside with this patient and engage patient  to actively check blood glucose and administer insulin injections.   Thanks, Christena Deem RN, MSN, BC-ADM Inpatient Diabetes Coordinator Team Pager [phone] (8a-5p)

## 2020-08-29 NOTE — Consult Note (Addendum)
Consult Note  Patient Name: Scott Franklin Date of Encounter: 08/29/2020  Primary Cardiologist: None  Subjective   [name] is a 45 year old male with PMHx of OSA on CPAP, obesity, hypopituitarism, and family history of hypertrophic cardiomyopathy presenting with new onset chest pain. He has been having intermittent musculoskeletal chest pains over the past week. However, yesterday evening he noted tightness in chest that was worsening with some radiation to left upper arm. Episode lasted ~10-20 minutes and spontaneously resolved. No further chest pain at this time.  EKG without any ST segment changes. Troponins negative x2. Echo pending.   Inpatient Medications    Scheduled Meds: . fluorometholone  1 drop Left Eye q AM  . glimepiride  2 mg Oral Q breakfast  . hydrochlorothiazide  12.5 mg Oral Daily  . insulin aspart  0-15 Units Subcutaneous TID WC  . lidocaine  1 patch Transdermal Q24H  . lisinopril  10 mg Oral Daily  . metFORMIN  500 mg Oral BID WC  . multivitamin with minerals  1 tablet Oral Q breakfast   Continuous Infusions:  PRN Meds: ondansetron (ZOFRAN) IV, triamcinolone   Vital Signs    Vitals:   08/28/20 2029 08/29/20 0003 08/29/20 0406 08/29/20 0815  BP: 139/70 133/80 116/67 137/80  Pulse: 89 82 76 77  Resp: 20 16 18 17   Temp: 98.4 F (36.9 C) 98.5 F (36.9 C) 98.7 F (37.1 C) 98.1 F (36.7 C)  TempSrc: Oral Oral Oral Oral  SpO2: 95% 94% 96% 96%  Weight: (!) 209.4 kg     Height: 5\' 9"  (1.753 m)      No intake or output data in the 24 hours ending 08/29/20 0826 Filed Weights   08/28/20 1546 08/28/20 2029  Weight: (!) 204.1 kg (!) 209.4 kg    Telemetry    NSR, 70's- Personally Reviewed  ECG    Sinus rhythm, no ST segment changes - Personally Reviewed  Physical Exam  Physical Exam  Constitutional: Young, obese male. No distress.  HENT: Normocephalic and atraumatic, EOMI, conjunctiva normal, moist mucous membranes Cardiovascular:  Normal rate, regular rhythm, S1 and S2 present, no murmurs, rubs, gallops.  Distal pulses intact Respiratory: No respiratory distress, no accessory muscle use.  Effort is normal.  Lungs are clear to auscultation bilaterally. GI: obese abdomen, nondistended, soft, nontender to palpation, active bowel sounds Musculoskeletal: Normal bulk and tone.  No peripheral edema noted. Neurological: Is alert and oriented x4, no apparent focal deficits noted. Skin: Warm and dry.  No rash, erythema, lesions noted. Psychiatric: Normal mood and affect. Behavior is normal. Judgment and thought content normal.   Labs    Chemistry Recent Labs  Lab 08/28/20 1130  NA 135  K 4.1  CL 101  CO2 25  GLUCOSE 327*  BUN 12  CREATININE 0.80  CALCIUM 9.0  GFRNONAA >60  ANIONGAP 9     Hematology Recent Labs  Lab 08/28/20 1130  WBC 8.3  RBC 4.56  HGB 13.0  HCT 39.5  MCV 86.6  MCH 28.5  MCHC 32.9  RDW 13.6  PLT 263    Cardiac EnzymesNo results for input(s): TROPONINI in the last 168 hours. No results for input(s): TROPIPOC in the last 168 hours.   BNPNo results for input(s): BNP, PROBNP in the last 168 hours.   DDimer No results for input(s): DDIMER in the last 168 hours.   Radiology    DG Chest 2 View  Result Date: 08/28/2020 CLINICAL DATA:  Shortness of breath and chest pain EXAM: CHEST - 2 VIEW COMPARISON:  January 29, 2016 FINDINGS: Lungs are clear. Heart is mildly enlarged with pulmonary vascularity normal. No adenopathy. No bone lesions. No pneumothorax. IMPRESSION: Mild cardiac enlargement.  Lungs clear. Electronically Signed   By: Bretta Bang III M.D.   On: 08/28/2020 12:23    Cardiac Studies   Echo 08/29/2020 > pending  Patient Profile     45 y.o. male with PMHx of OSA on CPAP, obesity, hypopituitarism, and family history of hypertrophic cardiomyopathy presenting with left sided chest pain. EKG and troponins wnl.   Assessment & Plan    Atypical chest pain Patient presented  with left sided chest pain for four days duration that started with activity initially but notes that it has worsened over past several days with some radiation to the left arm. EKG without ST segment changes and Troponins wnl. Pain is most consistent with musculoskeletal etiology. However, given family history of hypertrophic cardiomyopathy, would be reasonable to follow up with Echo.  He does have risk factors that put him at increased risk for coronary artery disease including morbid obesity, hyperlipidemia and diabetes. He will need significant lifestyle modifications including weight loss and dietary changes in addition to initiating statin for hyperlipidemia and metformin for diabetes.  - Microzide 12.5mg  daily and lisinopril 10mg  daily for hypertension, Goal SBP 120's  - Metformin 500mg  bid, uptitrate weekly as tolerated for 2000mg  daily for diabetes  - Atorvastatin 80mg  daily for hyperlipidemia  - Will need outpatient PCP follow up - F/u with cardiology as outpatient in 4-6 weeks   Signed, , MD Internal Medicine, PGY-2 08/29/20 8:30 AM Pager # [phone]  Personally seen and examined. Agree with above.   45 year old with newly diagnosed diabetes hemoglobin A1c 11, super morbid obesity with BMI of 68, father who had hypertrophic cardiomyopathy died in his 34s here with chest discomfort.  Noted it when moving his neck, moving trash cans. No shortness of breath.  No fevers chills nausea vomiting syncope bleeding.  Troponins were normal.  Echocardiogram preliminarily shows normal wall thickness, no evidence of hypertrophic cardiomyopathy.  Normal pump function no evidence of infarct.  Right ventricle is enlarged secondary to his obesity.  Assessment and plan  Chest pain Morbid obesity Newly discovered diabetes Hypertension  -We will try to establish with primary care physician. -No evidence of acute coronary syndrome.  Chest discomfort sounds musculoskeletal in  etiology. -We will have follow-up with him in clinic to make sure his symptoms do not develop into anything more worrisome. -No evidence of hypertrophic cardiomyopathy on echocardiogram.  Father had sudden death.  Okay for discharge from cardiac perspective  10/29/20, MD

## 2020-09-01 ENCOUNTER — Telehealth: Payer: Self-pay | Admitting: General Practice

## 2020-09-01 NOTE — Telephone Encounter (Signed)
Copied from CRM [id number]. Topic: Appointment Scheduling - Scheduling Inquiry for Clinic >> Sep 01, 2020 12:37 PM Pawlus, Gifford Shave wrote: Reason for CRM: Pt stated he was recently diagnosed with Diabetes, wanted to schedule a new patient appt and was wondering if he could be seen any sooner than June. Please advise if he can be seen any sooner.    Called Pt to help get him scheduled with a New Patient appt. Advised Pt that all providers are fully booked for new patient appts.  I tried to give him the soonest appointment with [facility] or send him to the [facility] but he didn't want either appointment. He has never been seen by any [facility]. Pt got rude with me because all the providers are fully booked and said I did not care enough to make him a slot. Explained to Pt that I could not make a slot without providers approval. Explained that if he was a patient he could message the his doctor directly and he asked for a MD direct line/information but I did not provide since Pt has not be seen here. He requested to see a MD and not a PA. I tried to explain to Pt the only openings we had was for a PA and he yelled and said PA are not doctors and said I didn't understand the difference. I did continue to try to help but he kept saying I did not understand and wasn't doing my job. So I told him I would be ending the call because it was nothing else I could do.

## 2020-09-06 ENCOUNTER — Other Ambulatory Visit: Payer: Self-pay

## 2020-09-06 ENCOUNTER — Emergency Department (HOSPITAL_COMMUNITY)
Admission: EM | Admit: 2020-09-06 | Discharge: 2020-09-06 | Disposition: A | Discharge status: Home / Self Care | Payer: BC Managed Care – PPO | Attending: Emergency Medicine | Admitting: Emergency Medicine

## 2020-09-06 ENCOUNTER — Emergency Department (HOSPITAL_COMMUNITY): Payer: BC Managed Care – PPO

## 2020-09-06 ENCOUNTER — Encounter (HOSPITAL_COMMUNITY): Payer: Self-pay | Admitting: Emergency Medicine

## 2020-09-06 DIAGNOSIS — I10 Essential (primary) hypertension: Secondary | ICD-10-CM | POA: Diagnosis not present

## 2020-09-06 DIAGNOSIS — Z7984 Long term (current) use of oral hypoglycemic drugs: Secondary | ICD-10-CM | POA: Diagnosis not present

## 2020-09-06 DIAGNOSIS — Z87891 Personal history of nicotine dependence: Secondary | ICD-10-CM | POA: Diagnosis not present

## 2020-09-06 DIAGNOSIS — R519 Headache, unspecified: Secondary | ICD-10-CM | POA: Diagnosis not present

## 2020-09-06 DIAGNOSIS — Z79899 Other long term (current) drug therapy: Secondary | ICD-10-CM | POA: Diagnosis not present

## 2020-09-06 DIAGNOSIS — E119 Type 2 diabetes mellitus without complications: Secondary | ICD-10-CM | POA: Insufficient documentation

## 2020-09-06 DIAGNOSIS — Z794 Long term (current) use of insulin: Secondary | ICD-10-CM | POA: Insufficient documentation

## 2020-09-06 LAB — BASIC METABOLIC PANEL
Anion gap: 12 (ref 5–15)
BUN: 21 mg/dL — ABNORMAL HIGH (ref 6–20)
CO2: 20 mmol/L — ABNORMAL LOW (ref 22–32)
Calcium: 8.8 mg/dL — ABNORMAL LOW (ref 8.9–10.3)
Chloride: 102 mmol/L (ref 98–111)
Creatinine, Ser: 0.96 mg/dL (ref 0.61–1.24)
GFR, Estimated: >60 mL/min (ref >60)
Glucose, Bld: 193 mg/dL — ABNORMAL HIGH (ref 70–99)
Potassium: 3.8 mmol/L (ref 3.5–5.1)
Sodium: 134 mmol/L — ABNORMAL LOW (ref 135–145)

## 2020-09-06 LAB — CBC WITH DIFFERENTIAL/PLATELET
Abs Immature Granulocytes: 0.05 10*3/uL (ref 0.00–0.07)
Basophils Absolute: 0.1 10*3/uL (ref 0.0–0.1)
Basophils Relative: 1 %
Eosinophils Absolute: 0.2 10*3/uL (ref 0.0–0.5)
Eosinophils Relative: 2 %
HCT: 40.2 % (ref 39.0–52.0)
Hemoglobin: 13.7 g/dL (ref 13.0–17.0)
Immature Granulocytes: 0 %
Lymphocytes Relative: 32 %
Lymphs Abs: 3.7 10*3/uL (ref 0.7–4.0)
MCH: 28.5 pg (ref 26.0–34.0)
MCHC: 34.1 g/dL (ref 30.0–36.0)
MCV: 83.6 fL (ref 80.0–100.0)
Monocytes Absolute: 0.9 10*3/uL (ref 0.1–1.0)
Monocytes Relative: 8 %
Neutro Abs: 6.7 10*3/uL (ref 1.7–7.7)
Neutrophils Relative %: 57 %
Platelets: 287 10*3/uL (ref 150–400)
RBC: 4.81 MIL/uL (ref 4.22–5.81)
RDW: 13.5 % (ref 11.5–15.5)
WBC: 11.7 10*3/uL — ABNORMAL HIGH (ref 4.0–10.5)
nRBC: 0 % (ref 0.0–0.2)

## 2020-09-06 LAB — LACTIC ACID, PLASMA: Lactic Acid, Venous: 1.1 mmol/L (ref 0.5–1.9)

## 2020-09-06 LAB — CK: Total CK: 162 U/L (ref 49–397)

## 2020-09-06 MED ORDER — SODIUM CHLORIDE 0.9 % IV SOLN
1000.0000 mL | INTRAVENOUS | Status: DC
  Administered 2020-09-06: 1000 mL via INTRAVENOUS

## 2020-09-06 MED ORDER — SODIUM CHLORIDE 0.9 % IV BOLUS (SEPSIS)
1000.0000 mL | Freq: Once | INTRAVENOUS | Status: AC
  Administered 2020-09-06: 1000 mL via INTRAVENOUS

## 2020-09-06 MED ORDER — KETOROLAC TROMETHAMINE 30 MG/ML IJ SOLN
30.0000 mg | Freq: Once | INTRAMUSCULAR | Status: AC
  Administered 2020-09-06: 30 mg via INTRAVENOUS
  Filled 2020-09-06: qty 1

## 2020-09-06 MED ORDER — TETRACAINE HCL 0.5 % OP SOLN
2.0000 [drp] | Freq: Once | OPHTHALMIC | Status: AC
  Administered 2020-09-06: 2 [drp] via OPHTHALMIC
  Filled 2020-09-06: qty 4

## 2020-09-06 MED ORDER — METOCLOPRAMIDE HCL 5 MG/ML IJ SOLN
10.0000 mg | Freq: Once | INTRAMUSCULAR | Status: AC
  Administered 2020-09-06: 10 mg via INTRAVENOUS
  Filled 2020-09-06: qty 2

## 2020-09-06 MED ORDER — DIPHENHYDRAMINE HCL 50 MG/ML IJ SOLN
50.0000 mg | Freq: Once | INTRAMUSCULAR | Status: AC
  Administered 2020-09-06: 50 mg via INTRAVENOUS
  Filled 2020-09-06: qty 1

## 2020-09-06 MED ORDER — ONDANSETRON 4 MG PO TBDP
4.0000 mg | ORAL_TABLET | Freq: Once | ORAL | Status: AC | PRN
  Administered 2020-09-06: 4 mg via ORAL
  Filled 2020-09-06: qty 1

## 2020-09-06 NOTE — ED Notes (Signed)
The pt reports that he just returned from c-t  Still has  A headache but it is better

## 2020-09-06 NOTE — Discharge Instructions (Signed)
You came to the emergency department today to be evaluated for your headache, nausea, vomiting and diarrhea.  Physical exam was reassuring.  Your CT scan showed no acute abnormality.  Your lab work was reassuring.  Please continue take your medication as prescribed.  Follow-up with your primary care appointment on Monday.  Get help right away if: Your migraine headache becomes severe. Your migraine headache lasts longer than 72 hours. You have a fever. You have a stiff neck. You have vision loss. Your muscles feel weak or like you cannot control them. You start to lose your balance often. You have trouble walking. You faint. You have a seizure.

## 2020-09-06 NOTE — ED Notes (Signed)
Pt actively vomiting in lobby.  Zofran ordered per protocol.

## 2020-09-06 NOTE — ED Notes (Signed)
Returned from CT.

## 2020-09-06 NOTE — ED Provider Notes (Signed)
Physical Exam  BP (!) 132/58   Pulse 97   Temp (!) 97.5 F (36.4 C) (Oral)   Resp 17   Ht 5\' 5"  (1.651 m)   Wt (!) 210 kg   SpO2 94%   BMI 77.04 kg/m   Physical Exam Vitals and nursing note reviewed.  Constitutional:      General: He is not in acute distress.    Appearance: He is morbidly obese. He is not ill-appearing, toxic-appearing or diaphoretic.  HENT:     Head: Normocephalic.  Eyes:     General: No scleral icterus.       Right eye: No discharge.        Left eye: No discharge.  Cardiovascular:     Rate and Rhythm: Normal rate.  Pulmonary:     Effort: Pulmonary effort is normal. No respiratory distress.     Breath sounds: No stridor.  Abdominal:     General: Abdomen is protuberant. There is no distension. There are no signs of injury.     Palpations: There is no mass or pulsatile mass.     Tenderness: There is no abdominal tenderness. There is no guarding or rebound.     Hernia: There is no hernia in the umbilical area or ventral area.  Skin:    General: Skin is warm and dry.     Coloration: Skin is not jaundiced or pale.  Neurological:     General: No focal deficit present.     Mental Status: He is alert.  Psychiatric:        Behavior: Behavior is cooperative.     ED Course/Procedures     Procedures  MDM   Care of patient assumed from PA Bouton at 1600.  Agree with history, physical exam and plan.  See their note for further details. Briefly, presenting complaining of headache.  Patient report he developed gradual onset of left-sided headache that started last night with increase in the intensity throughout the night but did improve this morning.  Headache is described as a sharp throbbing sensation waxing waning with associate nausea vomiting and some diarrhea.  States he has had multiple episodes of nonbloody nonbilious vomiting as well as some mild loose stools without blood or mucus.  He denies any associated fever.  He does endorse some mild sore throat  from vomiting.  No runny nose sneezing coughing chest pain shortness of breath abdominal pain back pain or dysuria.  No recent sick contact.  He has been fully vaccinated for COVID-19.  He does have history of migraine headache in the past.  He denies any having any focal numbness or focal weakness.  No rash.  He was recently diagnosed with diabetes and was started on 6 different medications approximately 1 week ago.  He is unsure of his symptoms related to it.  Medication including Amaryl, insulin, Metformin, Lipitor, and some blood pressure medications  Pending CK and lactic acid to evaluate for possible rhabdomyolysis induced from recently starting Lipitor and lactic acidosis from recently starting Metformin.   CK within normal limits.  Lactic acid within normal limits.  Reevaluation patient reports improvement in his symptoms.  Patient states that he has appointment with primary care provider on this coming Monday.  Patient is agreeable to discharge at this time with PCP follow-up.  Discussed results, findings, treatment and follow up. Patient advised of return precautions. Patient verbalized understanding and agreed with plan.        Wednesday, Scott Franklin 09/07/20 0129  Arby Barrette, MD 09/12/20 [phone]

## 2020-09-06 NOTE — ED Notes (Signed)
Hx of migraine headaches

## 2020-09-06 NOTE — ED Provider Notes (Signed)
[facility] EMERGENCY DEPARTMENT Provider Note   CSN: [id number] Arrival date & time: 09/06/20  0636     History Chief Complaint  Patient presents with  . Emesis    Scott Franklin is a 45 y.o. male.  The history is provided by the patient and medical records. No language interpreter was used.     45 year old male with history of obesity, diabetes, hypertension, presenting complaining of headache.  Patient report he developed gradual onset of left-sided headache that started last night with increase in the intensity throughout the night but did improve this morning.  Headache is described as a sharp throbbing sensation waxing waning with associate nausea vomiting and some diarrhea.  States he has had multiple episodes of nonbloody nonbilious vomiting as well as some mild loose stools without blood or mucus.  He denies any associated fever.  He does endorse some mild sore throat from vomiting.  No runny nose sneezing coughing chest pain shortness of breath abdominal pain back pain or dysuria.  No recent sick contact.  He has been fully vaccinated for COVID-19.  He does have history of migraine headache in the past.  He denies any having any focal numbness or focal weakness.  No rash.  He was recently diagnosed with diabetes and was started on 6 different medications approximately 1 week ago.  He is unsure of his symptoms related to it.  Medication including Amaryl, insulin, Metformin, Lipitor, and some blood pressure medications.  Past Medical History:  Diagnosis Date  . Anemia   . Cellulitis   . Depression   . Diabetes mellitus without complication ([place])   . ED (erectile dysfunction)   . Hypertension   . Obesity   . Other testicular hypofunction   . Panhypopituitarism ([place])   . Sebaceous cyst   . Testosterone deficiency     Patient Active Problem List   Diagnosis Date Noted  . Precordial chest pain 08/28/2020  . Anemia 04/06/2012  . Routine general medical  examination at a health care facility 04/06/2012  . OSA on CPAP 12/21/2010  . Hypogonadism male 04/20/2007  . Obesity, morbid ([place]) 04/10/2007    History reviewed. No pertinent surgical history.     Family History  Problem Relation Age of Onset  . Hypertension Father   . Heart attack Father   . Hypertrophic cardiomyopathy Father   . Healthy Mother   . Diabetes Neg Hx     Social History   Tobacco Use  . Smoking status: Former Smoker    Packs/day: 0.50    Years: 3.00    Pack years: 1.50    Types: Cigarettes    Quit date: 06/21/1997    Years since quitting: 23.2  . Smokeless tobacco: Never Used  Substance Use Topics  . Alcohol use: No  . Drug use: No    Home Medications Prior to Admission medications   Medication Sig Start Date End Date Taking? Authorizing Provider  atorvastatin (LIPITOR) 80 MG tablet Take 1 tablet (80 mg total) by mouth daily. 08/30/20 11/28/20  Kayleen Memos, DO  blood glucose meter kit and supplies KIT Dispense based on patient and insurance preference. Use up to four times daily as directed. 08/29/20   Kayleen Memos, DO  blood glucose meter kit and supplies Dispense based on patient and insurance preference. Use up to four times daily as directed. (FOR ICD-10 E10.9, E11.9). 08/29/20   Kayleen Memos, DO  fluorometholone (FML) 0.1 % ophthalmic suspension Place 1  drop into the left eye in the morning.  10/21/19   [provider]  glimepiride (AMARYL) 2 MG tablet Take 1 tablet (2 mg total) by mouth daily with breakfast. 08/30/20 11/28/20  Kayleen Memos, DO  hydrochlorothiazide (MICROZIDE) 12.5 MG capsule Take 1 capsule (12.5 mg total) by mouth daily. 08/30/20 11/28/20  Kayleen Memos, DO  insulin aspart (NOVOLOG) 100 UNIT/ML FlexPen CBG 70 - 120: 0 units  CBG 121 - 150: 2 units  CBG 151 - 200: 3 units  CBG 201 - 250: 5 units  CBG 251 - 300: 8 units  CBG 301 - 350: 11 units  CBG 351 - 400: 15 units  CBG > 400: call MD and obtain STAT lab verification  08/29/20   Kayleen Memos, DO  insulin detemir (LEVEMIR) 100 UNIT/ML FlexPen Inject 10 Units into the skin daily. 08/29/20   Kayleen Memos, DO  lisinopril (ZESTRIL) 10 MG tablet Take 1 tablet (10 mg total) by mouth daily. 08/30/20 11/28/20  Kayleen Memos, DO  metFORMIN (GLUCOPHAGE) 500 MG tablet Take 1 tablet (500 mg total) by mouth 2 (two) times daily with a meal. 08/29/20 11/27/20  Kayleen Memos, DO  multivitamin (ONE-A-DAY MEN'S) TABS tablet Take 1 tablet by mouth daily with breakfast. 08/29/20 08/14/21  Kayleen Memos, DO  triamcinolone (KENALOG) 0.1 % Apply 1 application topically 2 (two) times daily as needed (flares). 08/01/20   [provider]    Allergies    Amoxicillin, Other, Soap, Corn oil, and Corn starch  Review of Systems   Review of Systems  All other systems reviewed and are negative.   Physical Exam Updated Vital Signs BP 134/64   Pulse 84   Temp (!) 97.5 F (36.4 C) (Oral)   Resp 14   Ht _0  (1.651 m)   Wt (!) 210 kg   SpO2 96%   BMI 77.04 kg/m   Physical Exam Vitals and nursing note reviewed.  Constitutional:      General: He is not in acute distress.    Appearance: He is well-developed. He is obese.     Comments: Morbidly obese patient sitting up in bed nontoxic.  HENT:     Head: Normocephalic and atraumatic.  Eyes:     Extraocular Movements: Extraocular movements intact.     Conjunctiva/sclera: Conjunctivae normal.     Pupils: Pupils are equal, round, and reactive to light.  Cardiovascular:     Rate and Rhythm: Normal rate and regular rhythm.     Pulses: Normal pulses.     Heart sounds: Normal heart sounds.  Pulmonary:     Effort: Pulmonary effort is normal.     Breath sounds: Normal breath sounds.  Abdominal:     Palpations: Abdomen is soft.     Tenderness: There is no abdominal tenderness.  Musculoskeletal:     Cervical back: Normal range of motion and neck supple. No rigidity or tenderness.  Skin:    Findings: No rash.  Neurological:      Mental Status: He is alert and oriented to person, place, and time.     Cranial Nerves: Cranial nerves are intact.     Sensory: Sensation is intact.     Motor: Motor function is intact.     Coordination: Coordination is intact.     Gait: Gait is intact.  Psychiatric:        Mood and Affect: Mood normal.     ED Results / Procedures / Treatments  Labs (all labs ordered are listed, but only abnormal results are displayed) Labs Reviewed  CBC WITH DIFFERENTIAL/PLATELET - Abnormal; Notable for the following components:      Result Value   WBC 11.7 (*)    All other components within normal limits  BASIC METABOLIC PANEL - Abnormal; Notable for the following components:   Sodium 134 (*)    CO2 20 (*)    Glucose, Bld 193 (*)    BUN 21 (*)    Calcium 8.8 (*)    All other components within normal limits  CK  BLOOD GAS, VENOUS  LACTIC ACID, PLASMA  LACTIC ACID, PLASMA    EKG EKG Interpretation  Date/Time:  Saturday September 06 2020 11:52:23 EDT Ventricular Rate:  87 PR Interval:    QRS Duration: 100 QT Interval:  384 QTC Calculation: 462 R Axis:     Text Interpretation: Sinus rhythm Borderline low voltage, extremity leads normal, no change from previos Confirmed by Charlesetta Shanks [phone]) on 09/06/2020 3:20:42 PM   Radiology CT Head Wo Contrast  Result Date: 09/06/2020 CLINICAL DATA:  Headache, new or worsening. Additional history provided: Left-sided headache with vision changes since yesterday. Additional history obtained from PA Domenic Moras: Patient has history of prior left retinal detachment. EXAM: CT HEAD WITHOUT CONTRAST TECHNIQUE: Contiguous axial images were obtained from the base of the skull through the vertex without intravenous contrast. COMPARISON:  No pertinent prior exams available for comparison. FINDINGS: Brain: Cerebral volume is normal. There is no acute intracranial hemorrhage. No demarcated cortical infarct. No extra-axial fluid collection. No evidence of  intracranial mass. No midline shift. Vascular: No hyperdense vessel. Skull: Normal. Negative for fracture or focal lesion. Sinuses/Orbits: 6 mm focus of gas within the anterior left globe. 2.2 cm left maxillary sinus mucous retention cyst. These results were called by telephone at the time of interpretation on 09/06/2020 at 2:29 pm to provider [facility] , who verbally acknowledged these results. IMPRESSION: Unremarkable non-contrast CT appearance of the brain. No evidence of acute intracranial abnormality. 6 mm focus of gas within the anterior left globe. The patient reportedly has a history of prior left retinal detachment and this may be postprocedural. However, clinical correlation is recommended. 2.2 cm left maxillary sinus mucous retention cyst. Electronically Signed   By: Kellie Simmering DO   On: 09/06/2020 14:32    Procedures Procedures   Medications Ordered in ED Medications  sodium chloride 0.9 % bolus 1,000 mL (0 mLs Intravenous Stopped 09/06/20 1450)    Followed by  sodium chloride 0.9 % bolus 1,000 mL (0 mLs Intravenous Stopped 09/06/20 1450)    Followed by  0.9 %  sodium chloride infusion (has no administration in time range)  ketorolac (TORADOL) 30 MG/ML injection 30 mg (has no administration in time range)  ondansetron (ZOFRAN-ODT) disintegrating tablet 4 mg (4 mg Oral Given 09/06/20 0734)  diphenhydrAMINE (BENADRYL) injection 50 mg (50 mg Intravenous Given 09/06/20 1259)  metoCLOPramide (REGLAN) injection 10 mg (10 mg Intravenous Given 09/06/20 1259)  tetracaine (PONTOCAINE) 0.5 % ophthalmic solution 2 drop (2 drops Right Eye Given by Other 09/06/20 1515)    ED Course  I have reviewed the triage vital signs and the nursing notes.  Pertinent labs & imaging results that were available during my care of the patient were reviewed by me and considered in my medical decision making (see chart for details).    MDM Rules/Calculators/A&P  BP (!) 132/58   Pulse 97    Temp (!) 97.5 F (36.4 C) (Oral)   Resp 17   Ht _0  (1.651 m)   Wt (!) 210 kg   SpO2 94%   BMI 77.04 kg/m   Final Clinical Impression(s) / ED Diagnoses Final diagnoses:  Bad headache    Rx / DC Orders ED Discharge Orders    None     12:46 PM Patient here with headache along with nausea vomiting diarrhea.  States he has multiple episodes of pretty violent vomiting as well as having some loose stools.  Does have history of migraine headache and this felt more intense.  Currently rates headache as 5 out of 10.  He does not have any focal neuro deficit on exam but given the acute onset of headache along with nausea, head CT scan order.  Will give migraine cocktail.  He was recently started on several medications for high blood pressure as well as diabetes.  Unsure of this is a resultant of new medication.  Patient mentioned he has had left retinal detachment and currently being managed by ophthalmologist.  He denies any recent trauma.  Will obtain additional work-up which include intraocular pressure of left eyelid to rule out acute angle glaucoma causing headache and vomiting.  I have also considered medication induced headache will check lactic acid to rule out lactic acidosis secondary to Metformin use, will check CK to rule out rhabdomyolysis myositis due to Lipitor use.  I have also considered venous dural sinus thrombosis.  I have low suspicion for space-occupying lesion, subarachnoid hemorrhage, or meningitis as a source.  Care discussed with Dr. Johnney Killian.    3:16 PM I did perform pressure check of the left eyes to rule out acute angle glaucoma.  Fortunately intraocular pressures 15 and within normal limits.  After receiving antinausea medication patient reports feeling better.  Head CT scan without any acute intracranial abnormalities.  There is a 6 mm focus of gas within the anterior left globe.  Patient report he recently had a left retinal detachment and during the repair they  did insert inert gas.  This is likely residual of that.  No recent trauma to suggest traumatic head injury causing this focus of gas.  3:52 PM Will continue treatment of headache.  Pt sign out to oncoming team who will continue with work up and will dispo as appropriate.    Domenic Moras, PA-C 09/06/20 1555    Charlesetta Shanks, MD 09/06/20 2156

## 2020-09-06 NOTE — ED Triage Notes (Signed)
Patient woke up this morning with emesis and mild headache , denies diarrhea or fever .

## 2020-09-08 ENCOUNTER — Ambulatory Visit
Admission: RE | Admit: 2020-09-08 | Discharge: 2020-09-08 | Disposition: A | Discharge status: Home / Self Care | Payer: BC Managed Care – PPO | Source: Ambulatory Visit | Attending: Physician Assistant | Admitting: Physician Assistant

## 2020-09-08 ENCOUNTER — Other Ambulatory Visit: Payer: Self-pay | Admitting: Physician Assistant

## 2020-09-08 DIAGNOSIS — E1165 Type 2 diabetes mellitus with hyperglycemia: Secondary | ICD-10-CM | POA: Diagnosis not present

## 2020-09-08 DIAGNOSIS — M542 Cervicalgia: Secondary | ICD-10-CM

## 2020-09-08 DIAGNOSIS — R7989 Other specified abnormal findings of blood chemistry: Secondary | ICD-10-CM | POA: Diagnosis not present

## 2020-09-08 DIAGNOSIS — Z794 Long term (current) use of insulin: Secondary | ICD-10-CM | POA: Diagnosis not present

## 2020-09-10 DIAGNOSIS — R7989 Other specified abnormal findings of blood chemistry: Secondary | ICD-10-CM | POA: Diagnosis not present

## 2020-09-23 DIAGNOSIS — Z713 Dietary counseling and surveillance: Secondary | ICD-10-CM | POA: Diagnosis not present

## 2020-09-26 DIAGNOSIS — M5412 Radiculopathy, cervical region: Secondary | ICD-10-CM | POA: Diagnosis not present

## 2020-10-02 DIAGNOSIS — Z8669 Personal history of other diseases of the nervous system and sense organs: Secondary | ICD-10-CM | POA: Diagnosis not present

## 2020-10-02 DIAGNOSIS — Z9889 Other specified postprocedural states: Secondary | ICD-10-CM | POA: Diagnosis not present

## 2020-10-02 DIAGNOSIS — H15833 Staphyloma posticum, bilateral: Secondary | ICD-10-CM | POA: Diagnosis not present

## 2020-10-02 DIAGNOSIS — H442A3 Degenerative myopia with choroidal neovascularization, bilateral eye: Secondary | ICD-10-CM | POA: Diagnosis not present

## 2020-10-02 DIAGNOSIS — H442A2 Degenerative myopia with choroidal neovascularization, left eye: Secondary | ICD-10-CM | POA: Diagnosis not present

## 2020-10-07 DIAGNOSIS — Z713 Dietary counseling and surveillance: Secondary | ICD-10-CM | POA: Diagnosis not present

## 2020-10-21 DIAGNOSIS — M5412 Radiculopathy, cervical region: Secondary | ICD-10-CM | POA: Diagnosis not present

## 2020-10-21 DIAGNOSIS — M542 Cervicalgia: Secondary | ICD-10-CM | POA: Diagnosis not present

## 2020-11-05 ENCOUNTER — Ambulatory Visit (INDEPENDENT_AMBULATORY_CARE_PROVIDER_SITE_OTHER): Payer: BC Managed Care – PPO | Admitting: Internal Medicine

## 2020-11-05 ENCOUNTER — Encounter: Payer: Self-pay | Admitting: Internal Medicine

## 2020-11-05 ENCOUNTER — Other Ambulatory Visit: Payer: Self-pay

## 2020-11-05 VITALS — BP 116/78 | HR 79 | Ht 69.0 in | Wt >= 6400 oz

## 2020-11-05 DIAGNOSIS — E291 Testicular hypofunction: Secondary | ICD-10-CM | POA: Diagnosis not present

## 2020-11-05 DIAGNOSIS — E119 Type 2 diabetes mellitus without complications: Secondary | ICD-10-CM | POA: Diagnosis not present

## 2020-11-05 LAB — COMPREHENSIVE METABOLIC PANEL
ALT: 26 U/L (ref 0–53)
AST: 16 U/L (ref 0–37)
Albumin: 4.2 g/dL (ref 3.5–5.2)
Alkaline Phosphatase: 83 U/L (ref 39–117)
BUN: 18 mg/dL (ref 6–23)
CO2: 29 mEq/L (ref 19–32)
Calcium: 9.3 mg/dL (ref 8.4–10.5)
Chloride: 101 mEq/L (ref 96–112)
Creatinine, Ser: 0.89 mg/dL (ref 0.40–1.50)
GFR: 104.05 mL/min (ref >60.00)
Glucose, Bld: 104 mg/dL — ABNORMAL HIGH (ref 70–99)
Potassium: 4.2 mEq/L (ref 3.5–5.1)
Sodium: 140 mEq/L (ref 135–145)
Total Bilirubin: 0.7 mg/dL (ref 0.2–1.2)
Total Protein: 7.2 g/dL (ref 6.0–8.3)

## 2020-11-05 LAB — LUTEINIZING HORMONE: LH: 5.01 m[IU]/mL (ref 1.50–9.30)

## 2020-11-05 LAB — TESTOSTERONE: Testosterone: 212.03 ng/dL — ABNORMAL LOW (ref 300.00–890.00)

## 2020-11-05 LAB — PSA: PSA: 0.6 ng/mL (ref 0.10–4.00)

## 2020-11-05 MED ORDER — GLIMEPIRIDE 2 MG PO TABS: 4.0000 mg | ORAL_TABLET | Freq: Every day | ORAL | 1 refills | Status: DC

## 2020-11-05 MED ORDER — METFORMIN HCL 500 MG PO TABS: 1000.0000 mg | ORAL_TABLET | Freq: Two times a day (BID) | ORAL | 1 refills | Status: DC

## 2020-11-05 NOTE — Progress Notes (Signed)
Name: Scott Franklin  Age/ Sex: 45 y.o., male   MRN/ DOB: [id number], [birth year]     PCP: Johna Roles, PA   Reason for Endocrinology Evaluation: Type 2 Diabetes Mellitus/ Hypogonadism  Initial Endocrine Consultative Visit: 11/05/2020    PATIENT IDENTIFIER: Scott Franklin is a 45 y.o. male with a past medical history of T2DM, OSA on CPAP ,HTN, Obesity and hypogonadism. The patient has followed with Endocrinology clinic since 11/05/2020 for consultative assistance with management of his diabetes.  DIABETIC HISTORY:  Scott Franklin was diagnosed with DM in  08/2019. His hemoglobin A1c was 11.2%   Eats 3 meals a day, occasional snacks at night if BG's are tight.  No pancreatitis   HYPOGONADISM HISTORY: He was diagnosed with idiopathic central hypogonadism in 2008. He was treated with Clomid at the time . CT head 08/2020 was unremarkable   He saw Dr. Loanne Drilling in 2017 but was lost to follow up until hyis return to our office in 10/2020    SUBJECTIVE:   During the last visit (2017): Restarted on Clomid    Today (11/05/2020): Scott Franklin is here for a follow up on diabetes and hypogonadism.  He checks his blood sugars 3 times daily. The patient has not had hypoglycemic episodes since the last clinic visit.  Has been off clomiphene since 2017 - which helped in the past  With his energy as well as muscle mass.   Denies erectile dysfunction Libido is stable  No biological kids   No marijuana products  No chronic narcotic use  Denies breast tissue  Great grandfather with hx of prostate ca    He works for [employer]:  Metformin 500 mg 1 tabs BID  Glimepiride 2  Mg daily  Levemir 10 units daily    Statin: yes ACE-I/ARB: yes Prior Diabetic Education: yes    METER DOWNLOAD SUMMARY: Date range evaluated: 5/4-5/18/2022  Average Number Tests/Day =2.6 Overall Mean FS Glucose = 100 Standard Deviation = 20  BG Ranges:  Low = 71 High =  182   Hypoglycemic Events/30 Days: BG < 50 = 0 Episodes of symptomatic severe hypoglycemia = 0    DIABETIC COMPLICATIONS: Microvascular complications:  Left retinal detachment - not related to diabetes  Denies: retinopathy, neuropathy, CKD Last Eye Exam: Completed 08/21/2020  Macrovascular complications:   Denies: CAD, CVA, PVD   HISTORY:  Past Medical History:  Past Medical History:  Diagnosis Date  . Anemia   . Cellulitis   . Depression   . Diabetes mellitus without complication ([place])   . ED (erectile dysfunction)   . Hypertension   . Obesity   . Other testicular hypofunction   . Panhypopituitarism ([place])   . Sebaceous cyst   . Testosterone deficiency    Past Surgical History: No past surgical history on file. Social History:  reports that he quit smoking about 23 years ago. His smoking use included cigarettes. He has a 1.50 pack-year smoking history. He has never used smokeless tobacco. He reports that he does not drink alcohol and does not use drugs. Family History:  Family History  Problem Relation Age of Onset  . Hypertension Father   . Heart attack Father   . Hypertrophic cardiomyopathy Father   . Healthy Mother   . Diabetes Neg Hx      HOME MEDICATIONS: Allergies as of 11/05/2020      Reactions   Amoxicillin Anaphylaxis, Swelling, Other (See Comments)   Throat swells  Other Itching, Rash, Other (See Comments)   Perfumes and dyes from soap Eyes burn and itch with use of ophthalmic solutions containing preservatives   Soap Rash, Other (See Comments)   Sensitive to soap and detergent   Corn Oil Other (See Comments)   Pt reports experiencing emotional distress in response to exposure   Corn Starch Other (See Comments)   Pt reports experiencing emotional distress in response to exposure      Medication List       Accurate as of Nov 05, 2020  8:18 AM. If you have any questions, ask your nurse or doctor.        Accu-Chek Guide w/Device  Kit DISPENSE BASED ON PATIENT AND INSURANCE PREFERENCE. USE UP TO FOUR TIMES DAILY AS DIRECTED. (FOR ICD-10 E10.9, E11.9).   atorvastatin 80 MG tablet Commonly known as: LIPITOR TAKE 1 TABLET (80 MG TOTAL) BY MOUTH DAILY.   atorvastatin 80 MG tablet Commonly known as: LIPITOR Take 1 tablet (80 mg total) by mouth daily.   BD Pen Needle Nano U/F 32G X 4 MM Misc Generic drug: Insulin Pen Needle USE AS DIRECTED 4 TIMES DAILY   fluorometholone 0.1 % ophthalmic suspension Commonly known as: FML Place 1 drop into the left eye in the morning.   glimepiride 2 MG tablet Commonly known as: AMARYL TAKE 1 TABLET (2 MG TOTAL) BY MOUTH DAILY WITH BREAKFAST.   hydrochlorothiazide 12.5 MG tablet Commonly known as: HYDRODIURIL TAKE 1 TABLET (12.5 MG TOTAL) BY MOUTH DAILY.   hydrochlorothiazide 12.5 MG capsule Commonly known as: MICROZIDE Take 1 capsule (12.5 mg total) by mouth daily.   insulin detemir 100 UNIT/ML FlexPen Commonly known as: LEVEMIR Inject 10 Units into the skin daily.   Levemir FlexTouch 100 UNIT/ML FlexPen Generic drug: insulin detemir INJECT 10 UNITS INTO THE SKIN DAILY.   lisinopril 10 MG tablet Commonly known as: ZESTRIL TAKE 1 TABLET (10 MG TOTAL) BY MOUTH DAILY.   lisinopril 10 MG tablet Commonly known as: ZESTRIL Take 1 tablet (10 mg total) by mouth daily.   metFORMIN 500 MG tablet Commonly known as: GLUCOPHAGE Take 1 tablet (500 mg total) by mouth 2 (two) times daily with a meal.   metFORMIN 500 MG tablet Commonly known as: GLUCOPHAGE TAKE 1 TABLET (500 MG TOTAL) BY MOUTH TWO TIMES DAILY WITH A MEAL.   multivitamin Tabs tablet Take 1 tablet by mouth daily with breakfast.   NovoLOG FlexPen 100 UNIT/ML FlexPen Generic drug: insulin aspart USE AS DIRECTED: 70-120=0UNITS; 121-150=2UNITS; 151-200=3UNITS; 201-250=5UNITS; 251-300=8UNITS; 301-350=11UNITS; 351-400=15UNITS >400 CALL MD IMMEDIATELY What changed: Another medication with the same name was  removed. Continue taking this medication, and follow the directions you see here. Changed by: Dorita Sciara, MD   triamcinolone cream 0.1 % Commonly known as: KENALOG Apply 1 application topically 2 (two) times daily as needed (flares).        OBJECTIVE:   Vital Signs: BP 116/78   Pulse 79   Ht 5' 9"  (1.753 m)   Wt (!) 422 lb 2 oz (191.5 kg)   SpO2 98%   BMI 62.34 kg/m   Wt Readings from Last 3 Encounters:  11/05/20 (!) 422 lb 2 oz (191.5 kg)  09/06/20 (!) 462 lb 15.5 oz (210 kg)  08/28/20 (!) 461 lb 9.6 oz (209.4 kg)     Exam: General: Pt appears well and is in NAD  Neck: General: Supple without adenopathy. Thyroid: Thyroid size normal.  No goiter or nodules appreciated.   Chest :  No gynecomastia   Lungs: Clear with good BS bilat with no rales, rhonchi, or wheezes  Heart: RRR   Abdomen:  soft, nontender  Genitals:  Deferred  Extremities: 1+ pretibial edema.   Neuro: MS is good with appropriate affect, pt is alert and Ox3     DATA REVIEWED:  Lab Results  Component Value Date   HGBA1C 11.2 (H) 08/28/2020   HGBA1C 6.1 04/06/2012   Lab Results  Component Value Date   LDLCALC 120 (H) 08/28/2020   CREATININE 0.96 09/06/2020     Lab Results  Component Value Date   CHOL 212 (H) 08/28/2020   HDL 41 08/28/2020   LDLCALC 120 (H) 08/28/2020   TRIG 254 (H) 08/28/2020   CHOLHDL 5.2 08/28/2020       09/10/2020 Serum testosterone 166 ng/dL ( 264-916) Free testosterone 6.3 pg/mL (6.8-21.5)     Results for Franklin, Scott Franklin (MRN [id number]) as of 11/06/2020 12:41  Ref. Range 11/05/2020 09:00  Sodium Latest Ref Range: 135 - 145 mEq/L 140  Potassium Latest Ref Range: 3.5 - 5.1 mEq/L 4.2  Chloride Latest Ref Range: 96 - 112 mEq/L 101  CO2 Latest Ref Range: 19 - 32 mEq/L 29  Glucose Latest Ref Range: 70 - 99 mg/dL 104 (H)  BUN Latest Ref Range: 6 - 23 mg/dL 18  Creatinine Latest Ref Range: 0.40 - 1.50 mg/dL 0.89  Calcium Latest Ref Range: 8.4 - 10.5 mg/dL  9.3  Alkaline Phosphatase Latest Ref Range: 39 - 117 U/L 83  Albumin Latest Ref Range: 3.5 - 5.2 g/dL 4.2  AST Latest Ref Range: 0 - 37 U/L 16  ALT Latest Ref Range: 0 - 53 U/L 26  Total Protein Latest Ref Range: 6.0 - 8.3 g/dL 7.2  Total Bilirubin Latest Ref Range: 0.2 - 1.2 mg/dL 0.7  GFR Latest Ref Range: >60.00 mL/min 104.05  LH Latest Ref Range: 1.50 - 9.30 mIU/mL 5.01  Prolactin Latest Ref Range: 2.0 - 18.0 ng/mL 7.3  Testosterone Latest Ref Range: 300.00 - 890.00 ng/dL 212.03 (L)  PSA Latest Ref Range: 0.10 - 4.00 ng/mL 0.60    ASSESSMENT / PLAN / RECOMMENDATIONS:   1) Type 2 Diabetes Mellitus, Newly Diagnosed, Without complications - Most recent A1c of 11.2 %. Goal A1c < 7.0 %.     - I have discussed with the patient the pathophysiology of diabetes. We went over the natural progression of the disease. We talked about both insulin resistance and insulin deficiency. We stressed the importance of lifestyle changes including diet and exercise. I explained the complications associated with diabetes including retinopathy, nephropathy, neuropathy as well as increased risk of cardiovascular disease. We went over the benefit seen with glycemic control.   - I explained to the patient that diabetic patients are at higher than normal risk for amputations. - His BG's have been optimal with tight BG's, will stop insulin  - Will increase Metformin and Glimepiride  - He was advised to contact us with hypoglycemia (BG< 70)  - We discussed GLP-1 agonists and SGLT-2 inhibitors we discussed risks and benefits      MEDICATIONS: - Increase Metformin 500 mg 2 tablets twice daily  - Glimepiride 2 mg, TWO tablets before Breakfast  - STOP Levemir   EDUCATION / INSTRUCTIONS: BG monitoring instructions: Patient is instructed to check his blood sugars 3 times a day, before meals . Call [facility] if: BG persistently < 70  I reviewed the Rule of 15 for the treatment of hypoglycemia  in detail with the patient. Literature supplied.   2) Diabetic complications:  Eye: Does not have known diabetic retinopathy.  Neuro/ Feet: Does not have known diabetic peripheral neuropathy .  Renal: Patient does not have known baseline CKD. He   is  on an ACEI/ARB at present.    3) Hypogonadism:   - He has been diagnosed with hypogonadotrophic hypogonadism in ~2008 requiring Clomiphene. He was lost to follow up.  - Recent check shows low total and free testosterone this has been confirmed on his office visit today. -LH is inappropriately normal, prolactin level is normal, PSA normal -Patient is not interested in testosterone replacement therapy but rather, clomiphene   Medication Start clomiphene 50 mg daily    F/U in 3 months    Signed electronically by: Mack Guise, MD  [facility]  [facility] [address] [place], [place] [address] Phone: [phone] FAX: [phone]   CC: Johna Roles, [place] [address] Phone: [phone]  Fax: [phone]  Return to Endocrinology clinic as below: Future Appointments  Date Time Provider [place]  11/28/2020 10:45 AM Liliane Shi, PA-C [facility]

## 2020-11-05 NOTE — Patient Instructions (Addendum)
-   Increase Metformin 500 mg 2 tablets twice daily  - Glimepiride 2 mg, TWO tablets before Breakfast  - STOP Levemir      HOW TO TREAT LOW BLOOD SUGARS (Blood sugar LESS THAN 70 MG/DL)  Please follow the RULE OF 15 for the treatment of hypoglycemia treatment (when your (blood sugars are less than 70 mg/dL)    STEP 1: Take 15 grams of carbohydrates when your blood sugar is low, which includes:   3-4 GLUCOSE TABS  OR  3-4 OZ OF JUICE OR REGULAR SODA OR  ONE TUBE OF GLUCOSE GEL     STEP 2: RECHECK blood sugar in 15 MINUTES STEP 3: If your blood sugar is still low at the 15 minute recheck --> then, go back to STEP 1 and treat AGAIN with another 15 grams of carbohydrates.

## 2020-11-06 LAB — PROLACTIN: Prolactin: 7.3 ng/mL (ref 2.0–18.0)

## 2020-11-06 MED ORDER — CLOMIPHENE CITRATE 50 MG PO TABS: 50.0000 mg | ORAL_TABLET | Freq: Every day | ORAL | 6 refills | Status: DC

## 2020-11-11 ENCOUNTER — Telehealth: Payer: Self-pay | Admitting: Internal Medicine

## 2020-11-11 DIAGNOSIS — M542 Cervicalgia: Secondary | ICD-10-CM | POA: Diagnosis not present

## 2020-11-11 DIAGNOSIS — M5412 Radiculopathy, cervical region: Secondary | ICD-10-CM | POA: Diagnosis not present

## 2020-11-11 NOTE — Telephone Encounter (Signed)
Message left for patient to return my call.  

## 2020-11-11 NOTE — Telephone Encounter (Signed)
Pt calling in stating that provider changed medications and Blood sugar on was Friday night 11/07/2020 was 60. Yesterday around 5pm reading was 51.All weekend was low and having to eat more than normal and glucose pills to get sugar back up  This morning when checking blood sugar 131. Pt has not had medication this morning and is wanting to know what to do. Pt would like a call back as soon as possible.

## 2020-11-11 NOTE — Telephone Encounter (Signed)
Clinical advised pt of providers information

## 2020-11-11 NOTE — Telephone Encounter (Signed)
Please ask him to reduce Glimepiride to ONE tablet before breakfast daily ( currently he is on 2 tabs )

## 2020-11-14 DIAGNOSIS — M542 Cervicalgia: Secondary | ICD-10-CM | POA: Diagnosis not present

## 2020-11-14 DIAGNOSIS — M5412 Radiculopathy, cervical region: Secondary | ICD-10-CM | POA: Diagnosis not present

## 2020-11-18 DIAGNOSIS — M542 Cervicalgia: Secondary | ICD-10-CM | POA: Diagnosis not present

## 2020-11-18 DIAGNOSIS — M5412 Radiculopathy, cervical region: Secondary | ICD-10-CM | POA: Diagnosis not present

## 2020-11-20 DIAGNOSIS — H15833 Staphyloma posticum, bilateral: Secondary | ICD-10-CM | POA: Diagnosis not present

## 2020-11-20 DIAGNOSIS — H18603 Keratoconus, unspecified, bilateral: Secondary | ICD-10-CM | POA: Diagnosis not present

## 2020-11-20 DIAGNOSIS — Z9889 Other specified postprocedural states: Secondary | ICD-10-CM | POA: Diagnosis not present

## 2020-11-20 DIAGNOSIS — H442A3 Degenerative myopia with choroidal neovascularization, bilateral eye: Secondary | ICD-10-CM | POA: Diagnosis not present

## 2020-11-25 DIAGNOSIS — M542 Cervicalgia: Secondary | ICD-10-CM | POA: Diagnosis not present

## 2020-11-25 DIAGNOSIS — M5412 Radiculopathy, cervical region: Secondary | ICD-10-CM | POA: Diagnosis not present

## 2020-11-26 ENCOUNTER — Telehealth: Payer: Self-pay | Admitting: Internal Medicine

## 2020-11-26 NOTE — Telephone Encounter (Signed)
Spoken to patient and he stated that he have not started this medication for 2 reasons: 1) Patient is taking a lot of medications and he is concerned with interactions. He is currently on antibiotics as well. 2) Clomiphene require prior authorization which it came through today. I have already submitted it.  Patient is aware Dr Lonzo Cloud is out of the office and will wait until she returns.

## 2020-11-26 NOTE — Telephone Encounter (Signed)
Patient has some questions and concerns about the medication Clomiphene. Ph# [phone]

## 2020-11-27 ENCOUNTER — Telehealth: Payer: Self-pay | Admitting: Internal Medicine

## 2020-11-27 NOTE — Progress Notes (Deleted)
Cardiology Office Note:    Date:  11/27/2020   ID:  [name], DOB [birth year], MRN [id number]  PCP:  Delma Officer, PA   [facility] Providers Cardiologist:  None { Click to update primary MD,subspecialty MD or APP then REFRESH:1}  ***  Referring MD: No ref. provider found   Chief Complaint:  No chief complaint on file.    Patient Profile:    Scott Franklin is a 45 y.o. male with:  Dilated aortic root (40 mm) on Echocardiogram in 3/22 OSA on CPAP Morbid Obesity  Hypopituitarism  FHx of Hypertrophic Obstructive Cardiomyopathy   Diabetes mellitus (Dx 3/22) Hyperlipidemia   Prior CV studies:  Echocardiogram 08/29/20  1. Left ventricular ejection fraction, by estimation, is 55 to 60%. The  left ventricle has normal function. The left ventricle has no regional  wall motion abnormalities. Left ventricular diastolic parameters were  normal.   2. Right ventricular systolic function is mildly reduced. The right  ventricular size is mildly enlarged. Tricuspid regurgitation signal is  inadequate for assessing PA pressure.   3. The mitral valve was not well visualized. No evidence of mitral valve  regurgitation.   4. The aortic valve was not well visualized. Aortic valve regurgitation  is not visualized. No aortic stenosis is present.   5. Aortic dilatation noted. There is mild dilatation of the aortic root,  measuring 40 mm.   History of Present Illness: Mr. Legan was evaluated by Dr. Anne Fu during an admission in 3/22 with chest pain.  hsTroponins were neg. Echocardiogram demonstrated normal EF and no evidence of hypertrophic cardiomyopathy.  There was RV enlargement related to obesity.  He was diagnosed with diabetes during that admission and started on Rx.  Symptoms were felt to be noncardiac.  Follow-up was arranged with cardiology to evaluate for progression of symptoms.  ***        Past Medical History:  Diagnosis Date   Anemia    Cellulitis     Depression    Diabetes mellitus without complication (HCC)    ED (erectile dysfunction)    Hypertension    Obesity    Other testicular hypofunction    Panhypopituitarism (HCC)    Sebaceous cyst    Testosterone deficiency     Current Medications: No outpatient medications have been marked as taking for the 11/28/20 encounter (Appointment) with Tereso Newcomer T, PA-C.     Allergies:   Amoxicillin, Other, Soap, Corn oil, and Corn starch   Social History   Tobacco Use   Smoking status: Former    Packs/day: 0.50    Years: 3.00    Pack years: 1.50    Types: Cigarettes    Quit date: 06/21/1997    Years since quitting: 23.4   Smokeless tobacco: Never  Substance Use Topics   Alcohol use: No   Drug use: No     Family Hx: The patient's family history includes Healthy in his mother; Heart attack in his father; Hypertension in his father; Hypertrophic cardiomyopathy in his father. There is no history of Diabetes.  ROS   EKGs/Labs/Other Test Reviewed:    EKG:  EKG is *** ordered today.  The ekg ordered today demonstrates ***  Recent Labs: 08/28/2020: TSH 3.480 09/06/2020: Hemoglobin 13.7; Platelets 287 11/05/2020: ALT 26; BUN 18; Creatinine, Ser 0.89; Potassium 4.2; Sodium 140   Recent Lipid Panel Lab Results  Component Value Date/Time   CHOL 212 (H) 08/28/2020 09:06 PM   TRIG 254 (H) 08/28/2020 09:06 PM  HDL 41 08/28/2020 09:06 PM   LDLCALC 120 (H) 08/28/2020 09:06 PM      Risk Assessment/Calculations:   {Does this patient have ATRIAL FIBRILLATION?:[phone]}  Physical Exam:    VS:  There were no vitals taken for this visit.    Wt Readings from Last 3 Encounters:  11/05/20 (!) 422 lb 2 oz (191.5 kg)  09/06/20 (!) 462 lb 15.5 oz (210 kg)  08/28/20 (!) 461 lb 9.6 oz (209.4 kg)     Physical Exam ***     ASSESSMENT & PLAN:     {Are you ordering a CV Procedure (e.g. stress test, cath, DCCV, TEE, etc)?   Press F2        :[id number]}    Dispo:  No follow-ups on  file.   Medication Adjustments/Labs and Tests Ordered: Current medicines are reviewed at length with the patient today.  Concerns regarding medicines are outlined above.  Tests Ordered: No orders of the defined types were placed in this encounter.  Medication Changes: No orders of the defined types were placed in this encounter.   Signed, Tereso Newcomer, PA-C  11/27/2020 9:47 PM    [facility] [address] [place], [place], [place]  [address] Phone: [phone]; Fax: [phone]

## 2020-11-28 ENCOUNTER — Ambulatory Visit: Payer: BC Managed Care – PPO | Admitting: Physician Assistant

## 2020-11-28 NOTE — Telephone Encounter (Signed)
Patient called stating he needs his medications called in and he was not sure who Dr Margo Aye was. He said he thinks that's who he saw in the ER. I informed the patient that I would send the message to Dr Lonzo Cloud and she would have to let us know if she can call them in since Dr Margo Aye is not a Dr that he currently sees. Patient continued to ask to speak to someone who "knew what was going on" - I informed him that anyone who answers the phone would need to ask the Dr before anything was called in. Patient asked to speak to Dr Lonzo Cloud and I informed him she was not in until Monday. Patient then asked for Dr [facility] home phone number and I let him know that we do not know that information nor would we give that out. Patient continued to cut me off and ask to talk to someone that could help him call in his prescriptions and I informed him again, no one but the Dr could do that here. I told the patient I would send the message and to have a nice day, call was ended from there.

## 2020-11-28 NOTE — Telephone Encounter (Signed)
Please advise. I refused Rx so patient would contact office

## 2020-12-01 ENCOUNTER — Encounter: Payer: Self-pay | Admitting: Internal Medicine

## 2020-12-02 ENCOUNTER — Telehealth: Payer: Self-pay | Admitting: Pharmacy Technician

## 2020-12-02 NOTE — Telephone Encounter (Addendum)
Patient Advocate Encounter   Received notification from [facility] that prior authorization for CLOMID is required.   PA submitted on 12/02/2020 Key [id number] Status is N/A  I called the plan who stated I must contact [facility] [employer] at [phone].  I spoke with Barbara Cower who will fax needed forms. Status is PENDING.(Forms with provider for signature)  APPROVED  PA#  [id number] WIN [phone]    [facility] will continue to follow.   Netty Starring. Dimas Aguas, CPhT Patient Advocate [facility] Phone: [phone] Fax:  [phone]

## 2020-12-06 NOTE — Telephone Encounter (Signed)
Message sent to initiate PA.

## 2020-12-08 ENCOUNTER — Telehealth: Payer: Self-pay | Admitting: Internal Medicine

## 2020-12-08 NOTE — Telephone Encounter (Signed)
[facility] called to clarify if we have received their fax, they state they need a new 30 day prescription for Clomiphene sent over to them at fax # [phone] for the pt.

## 2020-12-08 NOTE — Telephone Encounter (Signed)
Please advise 

## 2020-12-10 ENCOUNTER — Other Ambulatory Visit: Payer: Self-pay | Admitting: *Deleted

## 2020-12-10 MED ORDER — CLOMIPHENE CITRATE 50 MG PO TABS: 50.0000 mg | ORAL_TABLET | Freq: Every day | ORAL | 6 refills | Status: DC

## 2020-12-10 NOTE — Telephone Encounter (Signed)
Print/[facility]ed Rx [facility] to [facility]--[phone].

## 2020-12-11 NOTE — Telephone Encounter (Signed)
Pharmacy called about PA authorization for CLOMID called to get diagnosis called, gave it and verified dosage

## 2020-12-31 DIAGNOSIS — E559 Vitamin D deficiency, unspecified: Secondary | ICD-10-CM | POA: Diagnosis not present

## 2020-12-31 DIAGNOSIS — Z6841 Body Mass Index (BMI) 40.0 and over, adult: Secondary | ICD-10-CM | POA: Diagnosis not present

## 2020-12-31 DIAGNOSIS — I10 Essential (primary) hypertension: Secondary | ICD-10-CM | POA: Diagnosis not present

## 2020-12-31 DIAGNOSIS — Z794 Long term (current) use of insulin: Secondary | ICD-10-CM | POA: Diagnosis not present

## 2020-12-31 DIAGNOSIS — E7800 Pure hypercholesterolemia, unspecified: Secondary | ICD-10-CM | POA: Diagnosis not present

## 2020-12-31 DIAGNOSIS — E1165 Type 2 diabetes mellitus with hyperglycemia: Secondary | ICD-10-CM | POA: Diagnosis not present

## 2021-02-13 ENCOUNTER — Encounter: Payer: Self-pay | Admitting: Internal Medicine

## 2021-02-13 ENCOUNTER — Other Ambulatory Visit: Payer: Self-pay

## 2021-02-13 ENCOUNTER — Ambulatory Visit (INDEPENDENT_AMBULATORY_CARE_PROVIDER_SITE_OTHER): Payer: BC Managed Care – PPO | Admitting: Internal Medicine

## 2021-02-13 VITALS — BP 118/64 | HR 74 | Ht 69.0 in | Wt 394.0 lb

## 2021-02-13 DIAGNOSIS — E119 Type 2 diabetes mellitus without complications: Secondary | ICD-10-CM

## 2021-02-13 DIAGNOSIS — E291 Testicular hypofunction: Secondary | ICD-10-CM | POA: Diagnosis not present

## 2021-02-13 DIAGNOSIS — E785 Hyperlipidemia, unspecified: Secondary | ICD-10-CM | POA: Diagnosis not present

## 2021-02-13 LAB — TESTOSTERONE: Testosterone: 361.84 ng/dL (ref 300.00–890.00)

## 2021-02-13 MED ORDER — GLIMEPIRIDE 2 MG PO TABS: 2.0000 mg | ORAL_TABLET | Freq: Every day | ORAL | 1 refills | Status: DC

## 2021-02-13 NOTE — Patient Instructions (Signed)
-   Keep Up the Good Work ! - Continue  Metformin 500 mg 2 tablets twice daily  - Glimepiride 2 mg, ONe tablet before Breakfast  - Continue Clomiphene 50 mg daily     HOW TO TREAT LOW BLOOD SUGARS (Blood sugar LESS THAN 70 MG/DL) Please follow the RULE OF 15 for the treatment of hypoglycemia treatment (when your (blood sugars are less than 70 mg/dL)   STEP 1: Take 15 grams of carbohydrates when your blood sugar is low, which includes:  3-4 GLUCOSE TABS  OR 3-4 OZ OF JUICE OR REGULAR SODA OR ONE TUBE OF GLUCOSE GEL    STEP 2: RECHECK blood sugar in 15 MINUTES STEP 3: If your blood sugar is still low at the 15 minute recheck --> then, go back to STEP 1 and treat AGAIN with another 15 grams of carbohydrates.

## 2021-02-13 NOTE — Progress Notes (Signed)
Name: Scott Franklin  Age/ Sex: 45 y.o., male   MRN/ DOB: [id number], 12-11-1975     PCP: Johna Roles, PA   Reason for Endocrinology Evaluation: Type 2 Diabetes Mellitus/ Hypogonadism  Initial Endocrine Consultative Visit: 11/05/2020    PATIENT IDENTIFIER: Scott Franklin is a 45 y.o. male with a past medical history of T2DM, OSA on CPAP ,HTN, Obesity and hypogonadism. The patient has followed with Endocrinology clinic since 11/05/2020 for consultative assistance with management of his diabetes.  DIABETIC HISTORY:  Scott Franklin was diagnosed with DM in  08/2019. His hemoglobin A1c was 11.2%   Eats 3 meals a day, occasional snacks at night if BG's are tight.  No pancreatitis    Stopped levemir 10/2020    HYPOGONADISM HISTORY: He was diagnosed with idiopathic central hypogonadism in 2008. He was treated with Clomid at the time . CT head 08/2020 was unremarkable   He saw Dr. Loanne Drilling in 2017 but was lost to follow up until hyis return to our office in 10/2020    Restarted Clomiphene 11/2020    No marijuana products  No chronic narcotic use  Denies breast tissue  Great grandfather with hx of prostate ca   SUBJECTIVE:   During the last visit (11/05/2020): A1c 11.2 % we increased Metformin and Glimepiride and stopped levemir, restarted Clomiphene      Today (02/13/2021): Scott Franklin is here for a follow up on diabetes and hypogonadism.  He checks his blood sugars 1 times daily. The patient has had hypoglycemic episodes since the last clinic visit. This has decreased in frequency unless he drinks alcohol which was a month .    Has been off clomiphene since 2017 - but was unable to start until 11/2020 due to autherization issues.  Has noted improvement in energy level and endurance  Denies nausea diarrhea or vomiting  Has left knee pain that radiated upwards as well as hip and shoulder , the pain is intermittent in nature, has been on Lipitor since 08/2020   Denies  erectile dysfunction Libido is stable  No biological kids     He works for [employer]:  Metformin 500 mg 2 tabs BID  Glimepiride 2  Mg ,1  tab daily  Clomiphene 50 mg daily    Statin: yes ACE-I/ARB: yes Prior Diabetic Education: yes    METER DOWNLOAD SUMMARY: Did not bring      DIABETIC COMPLICATIONS: Microvascular complications:  Left retinal detachment - not related to diabetes  Denies: retinopathy, neuropathy, CKD Last Eye Exam: Completed 08/21/2020  Macrovascular complications:   Denies: CAD, CVA, PVD   HISTORY:  Past Medical History:  Past Medical History:  Diagnosis Date   Anemia    Cellulitis    Depression    Diabetes mellitus without complication ([place])    ED (erectile dysfunction)    Hypertension    Obesity    Other testicular hypofunction    Panhypopituitarism ([place])    Sebaceous cyst    Testosterone deficiency    Past Surgical History: No past surgical history on file. Social History:  reports that he quit smoking about 23 years ago. His smoking use included cigarettes. He has a 1.50 pack-year smoking history. He has never used smokeless tobacco. He reports that he does not drink alcohol and does not use drugs. Family History:  Family History  Problem Relation Age of Onset   Hypertension Father    Heart attack Father    Hypertrophic  cardiomyopathy Father    Healthy Mother    Diabetes Neg Hx      HOME MEDICATIONS: Allergies as of 02/13/2021       Reactions   Amoxicillin Anaphylaxis, Swelling, Other (See Comments)   Throat swells   Other Itching, Rash, Other (See Comments)   Perfumes and dyes from soap Eyes burn and itch with use of ophthalmic solutions containing preservatives   Soap Rash, Other (See Comments)   Sensitive to soap and detergent   Corn Oil Other (See Comments)   Pt reports experiencing emotional distress in response to exposure   Corn Starch Other (See Comments)   Pt reports experiencing emotional  distress in response to exposure        Medication List        Accurate as of February 13, 2021  9:43 AM. If you have any questions, ask your nurse or doctor.          STOP taking these medications    BD Pen Needle Nano U/F 32G X 4 MM Misc Generic drug: Insulin Pen Needle Stopped by: Dorita Sciara, MD   NovoLOG FlexPen 100 UNIT/ML FlexPen Generic drug: insulin aspart Stopped by: Dorita Sciara, MD       TAKE these medications    Accu-Chek Guide w/Device Kit DISPENSE BASED ON PATIENT AND INSURANCE PREFERENCE. USE UP TO FOUR TIMES DAILY AS DIRECTED. (FOR ICD-10 E10.9, E11.9).   atorvastatin 80 MG tablet Commonly known as: LIPITOR TAKE 1 TABLET (80 MG TOTAL) BY MOUTH DAILY.   atorvastatin 80 MG tablet Commonly known as: LIPITOR Take 1 tablet (80 mg total) by mouth daily.   clomiPHENE 50 MG tablet Commonly known as: CLOMID Take 1 tablet (50 mg total) by mouth daily.   fluorometholone 0.1 % ophthalmic suspension Commonly known as: FML Place 1 drop into the left eye in the morning.   glimepiride 2 MG tablet Commonly known as: AMARYL Take 2 tablets (4 mg total) by mouth daily with breakfast.   hydrochlorothiazide 12.5 MG tablet Commonly known as: HYDRODIURIL TAKE 1 TABLET (12.5 MG TOTAL) BY MOUTH DAILY.   hydrochlorothiazide 12.5 MG capsule Commonly known as: MICROZIDE Take 1 capsule (12.5 mg total) by mouth daily.   lisinopril 10 MG tablet Commonly known as: ZESTRIL TAKE 1 TABLET (10 MG TOTAL) BY MOUTH DAILY.   lisinopril 10 MG tablet Commonly known as: ZESTRIL Take 1 tablet (10 mg total) by mouth daily.   metFORMIN 500 MG tablet Commonly known as: GLUCOPHAGE Take 2 tablets (1,000 mg total) by mouth 2 (two) times daily with a meal. What changed: Another medication with the same name was removed. Continue taking this medication, and follow the directions you see here. Changed by: Dorita Sciara, MD   multivitamin Tabs tablet Take 1  tablet by mouth daily with breakfast.   triamcinolone cream 0.1 % Commonly known as: KENALOG Apply 1 application topically 2 (two) times daily as needed (flares).         OBJECTIVE:   Vital Signs: BP 118/64   Pulse 74   Ht 5' 9" (1.753 m)   Wt (!) 394 lb (178.7 kg)   SpO2 95%   BMI 58.18 kg/m   Wt Readings from Last 3 Encounters:  02/13/21 (!) 394 lb (178.7 kg)  11/05/20 (!) 422 lb 2 oz (191.5 kg)  09/06/20 (!) 462 lb 15.5 oz (210 kg)     Exam: General: Pt appears well and is in NAD  Neck: General: Supple without  adenopathy. Thyroid: Thyroid size normal.  No goiter or nodules appreciated.   Chest :  No gynecomastia   Lungs: Clear with good BS bilat with no rales, rhonchi, or wheezes  Heart: RRR   Abdomen:  soft, nontender  Genitals:  Deferred  Extremities: 1+ pretibial edema.   Neuro: MS is good with appropriate affect, pt is alert and Ox3     DATA REVIEWED:  Lab Results  Component Value Date   HGBA1C 11.2 (H) 08/28/2020   HGBA1C 6.1 04/06/2012   Lab Results  Component Value Date   LDLCALC 120 (H) 08/28/2020   CREATININE 0.89 11/05/2020     Lab Results  Component Value Date   CHOL 212 (H) 08/28/2020   HDL 41 08/28/2020   LDLCALC 120 (H) 08/28/2020   TRIG 254 (H) 08/28/2020   CHOLHDL 5.2 08/28/2020       09/10/2020 Serum testosterone 166 ng/dL ( 264-916) Free testosterone 6.3 pg/mL (6.8-21.5)    01/01/2020 HDL 36 LDL 74  Tg 87 Vit 33.1  A1c 5.6%    Results for Scott Franklin, Scott Franklin (MRN [id number]) as of 02/13/2021 12:46  Ref. Range 11/05/2020 09:00 02/13/2021 10:02  Testosterone Latest Ref Range: 300.00 - 890.00 ng/dL 212.03 (L) 361.84   ASSESSMENT / PLAN / RECOMMENDATIONS:   1) Type 2 Diabetes Mellitus, optimally Controlled , Without complications - Most recent A1c of 5.6 %. Goal A1c < 7.0 %.    -I have praised the patient on 28 LB weight loss, and encouraged him to continue with lifestyle changes -Patient with an A1c of 5.6%, he attributes  his low A1c due to initial episodes of hypoglycemia when he was on glimepiride 2 tablets a day, this has been reduced to 1 tablet a day and he has not had any episodes of hypoglycemia for the past month. -He will continue to monitor his glucose readings and will notify me with any further episodes of hypoglycemia so we can discontinue glimepiride -No changes at this time    MEDICATIONS: -Continue metformin 500 mg 2 tablets twice daily  -Continue glimepiride 2 mg, 1 tablet before Breakfast   EDUCATION / INSTRUCTIONS: BG monitoring instructions: Patient is instructed to check his blood sugars 3 times a day, before meals . Call [facility] if: BG persistently < 70  I reviewed the Rule of 15 for the treatment of hypoglycemia in detail with the patient. Literature supplied.   2) Diabetic complications:  Eye: Does not have known diabetic retinopathy.  Neuro/ Feet: Does not have known diabetic peripheral neuropathy .  Renal: Patient does not have known baseline CKD. He   is  on an ACEI/ARB at present.    3) Hypogonadism:   - He has been diagnosed with hypogonadotrophic hypogonadism in ~2008 requiring Clomiphene. He was lost to follow up.  - Recent check shows low total and free testosterone this has been confirmed on his office visit today. -LH is inappropriately normal, prolactin level is normal, PSA normal -Patient is not interested in testosterone replacement therapy but rather, clomiphene -Testosterone level is normal today  Medication Start clomiphene 50 mg daily  4) Arthralgia :  -This is mainly centered on the left side of his body, he believes this could be positional, we also discussed statin therapy as a cause for arthralgias.  We will continue at this time but he will increase his vitamin D to 2000 IU daily per his PCPs recommendations  Continue MVI that has Vitamin D 1000 iu  Start vitamin D3  1000 IU daily   5) Dyslipidemia:  -I have praised the  patient on improved lipid panel with LDL at 74 mg/DL (down from 120 in May 2022) -We have opted to continue Lipitor 80 mg daily for now   F/U in 3 months    Signed electronically by: Mack Guise, MD  [facility]  [facility] [address] [place], [place] [address] Phone: [phone] FAX: [phone]   CC: Johna Roles, [place] [address] [place] [address] Phone: [phone]  Fax: [phone]  Return to Endocrinology clinic as below: No future appointments.

## 2021-02-16 DIAGNOSIS — L309 Dermatitis, unspecified: Secondary | ICD-10-CM | POA: Diagnosis not present

## 2021-02-19 DIAGNOSIS — Z8669 Personal history of other diseases of the nervous system and sense organs: Secondary | ICD-10-CM | POA: Diagnosis not present

## 2021-02-19 DIAGNOSIS — H442A3 Degenerative myopia with choroidal neovascularization, bilateral eye: Secondary | ICD-10-CM | POA: Diagnosis not present

## 2021-02-19 DIAGNOSIS — Z9889 Other specified postprocedural states: Secondary | ICD-10-CM | POA: Diagnosis not present

## 2021-02-19 DIAGNOSIS — H33321 Round hole, right eye: Secondary | ICD-10-CM | POA: Diagnosis not present

## 2021-02-20 DIAGNOSIS — L235 Allergic contact dermatitis due to other chemical products: Secondary | ICD-10-CM | POA: Diagnosis not present

## 2021-04-16 ENCOUNTER — Telehealth: Payer: Self-pay | Admitting: Internal Medicine

## 2021-04-16 NOTE — Telephone Encounter (Signed)
Patient requests to be called at ph# [phone] re: Patient states he is unable to get the following medication:clomiPHENE (CLOMID) 50 MG tablet Due to Manufacturer of the above medication told two Pharmacies that the medication has been discontinued and they are unable to get the medication.

## 2021-04-17 NOTE — Telephone Encounter (Signed)
Pharmacy had medication and will get ready for patient

## 2021-04-21 DIAGNOSIS — H18603 Keratoconus, unspecified, bilateral: Secondary | ICD-10-CM | POA: Diagnosis not present

## 2021-04-21 DIAGNOSIS — Z961 Presence of intraocular lens: Secondary | ICD-10-CM | POA: Diagnosis not present

## 2021-04-21 DIAGNOSIS — H33321 Round hole, right eye: Secondary | ICD-10-CM | POA: Diagnosis not present

## 2021-04-21 DIAGNOSIS — H442A3 Degenerative myopia with choroidal neovascularization, bilateral eye: Secondary | ICD-10-CM | POA: Diagnosis not present

## 2021-05-05 ENCOUNTER — Other Ambulatory Visit: Payer: Self-pay | Admitting: Internal Medicine

## 2021-06-04 DIAGNOSIS — Z20822 Contact with and (suspected) exposure to covid-19: Secondary | ICD-10-CM | POA: Diagnosis not present

## 2021-06-18 IMAGING — DX DG CERVICAL SPINE 2 OR 3 VIEWS
4 series · 4 of 4 positions shown · non-contrast
Comparison: None.

CLINICAL DATA: Cervical muscle pain. Patient reports posterior neck
pain for 1 year.

EXAM:
CERVICAL SPINE - 2-3 VIEW

[dg cervical spine 2 or 3 views (1 of 4)]
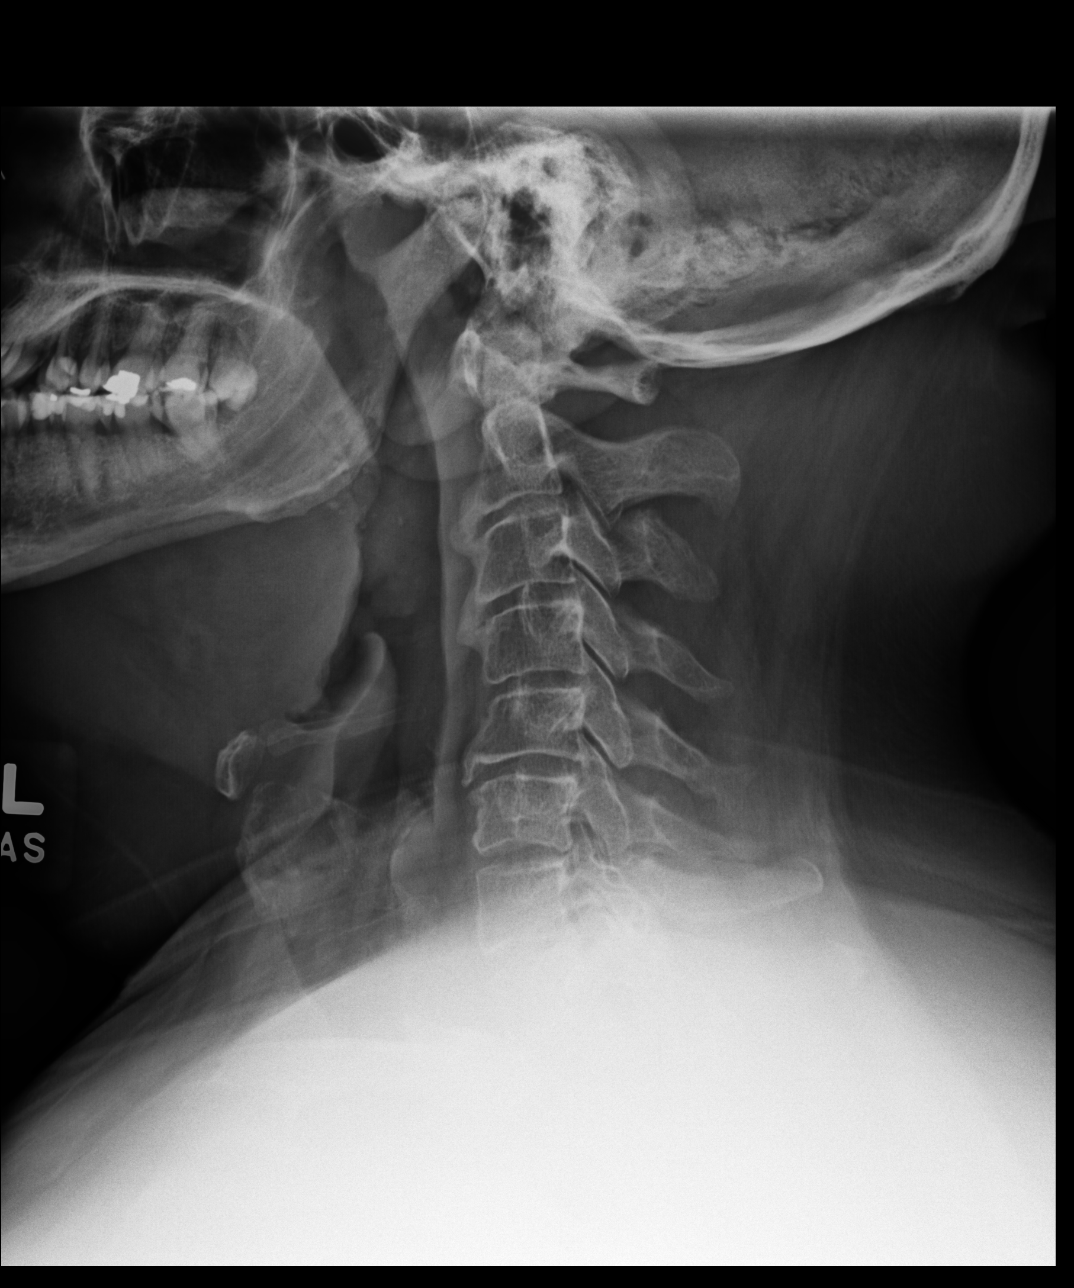

[dg cervical spine 2 or 3 views (2 of 4)]
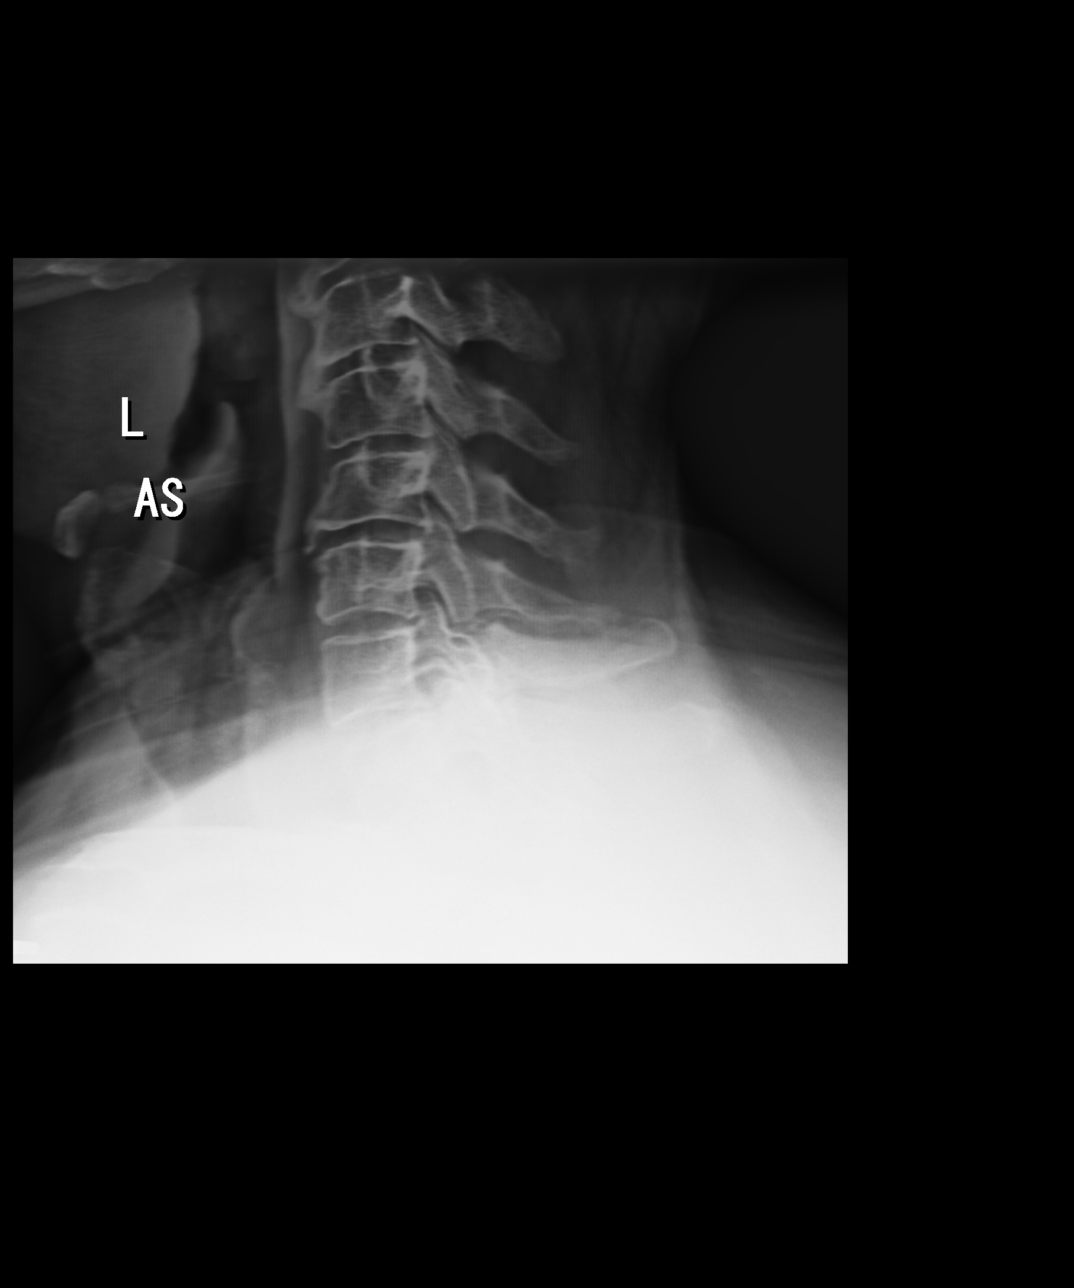

[dg cervical spine 2 or 3 views (3 of 4)]
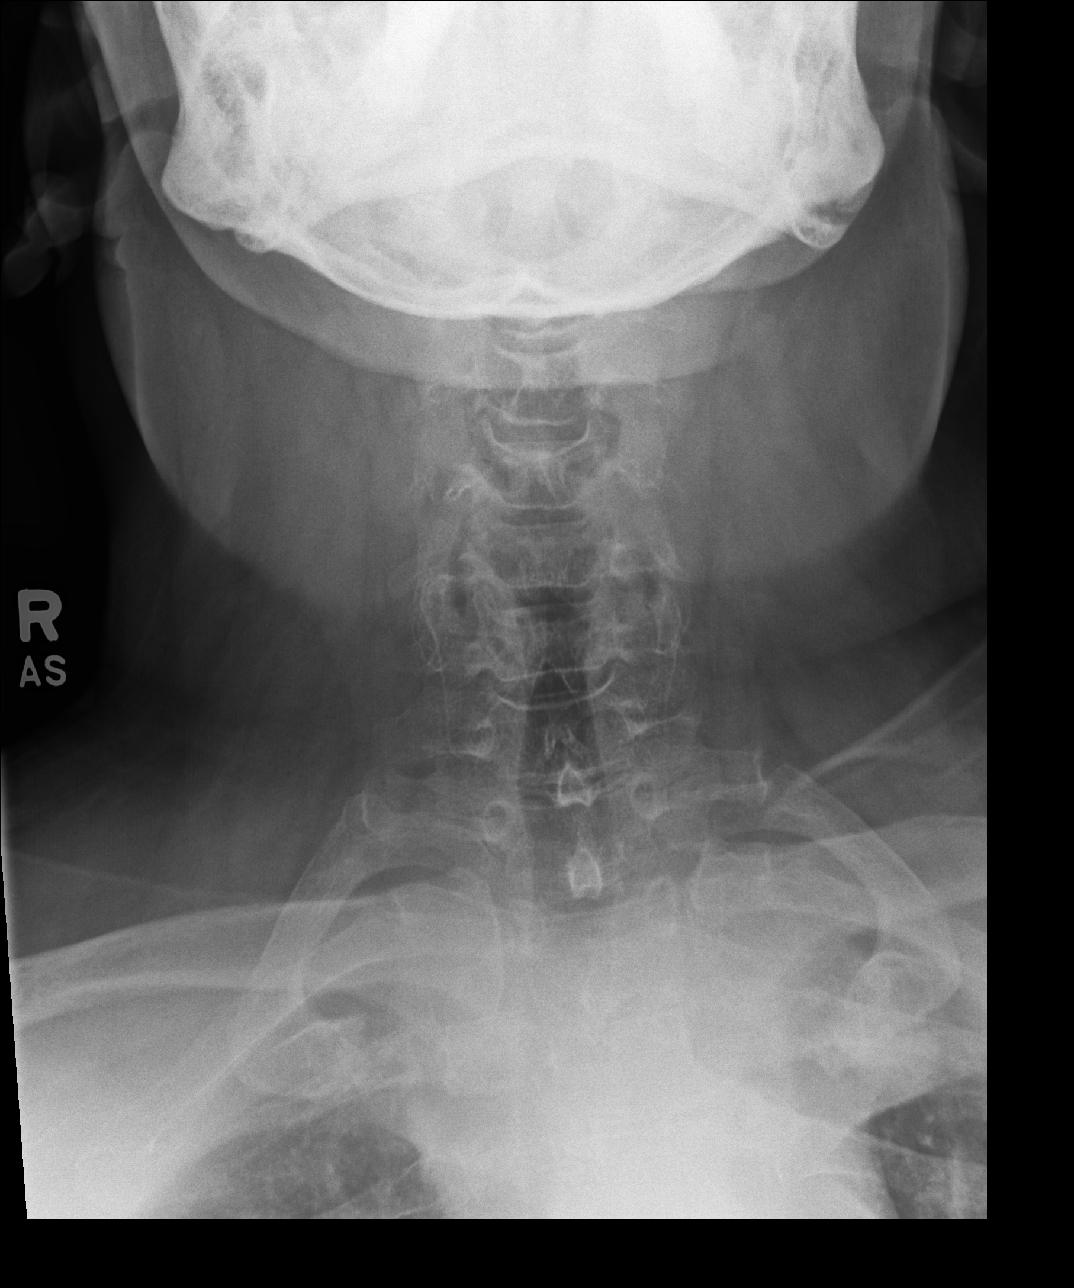

[dg cervical spine 2 or 3 views (4 of 4)]
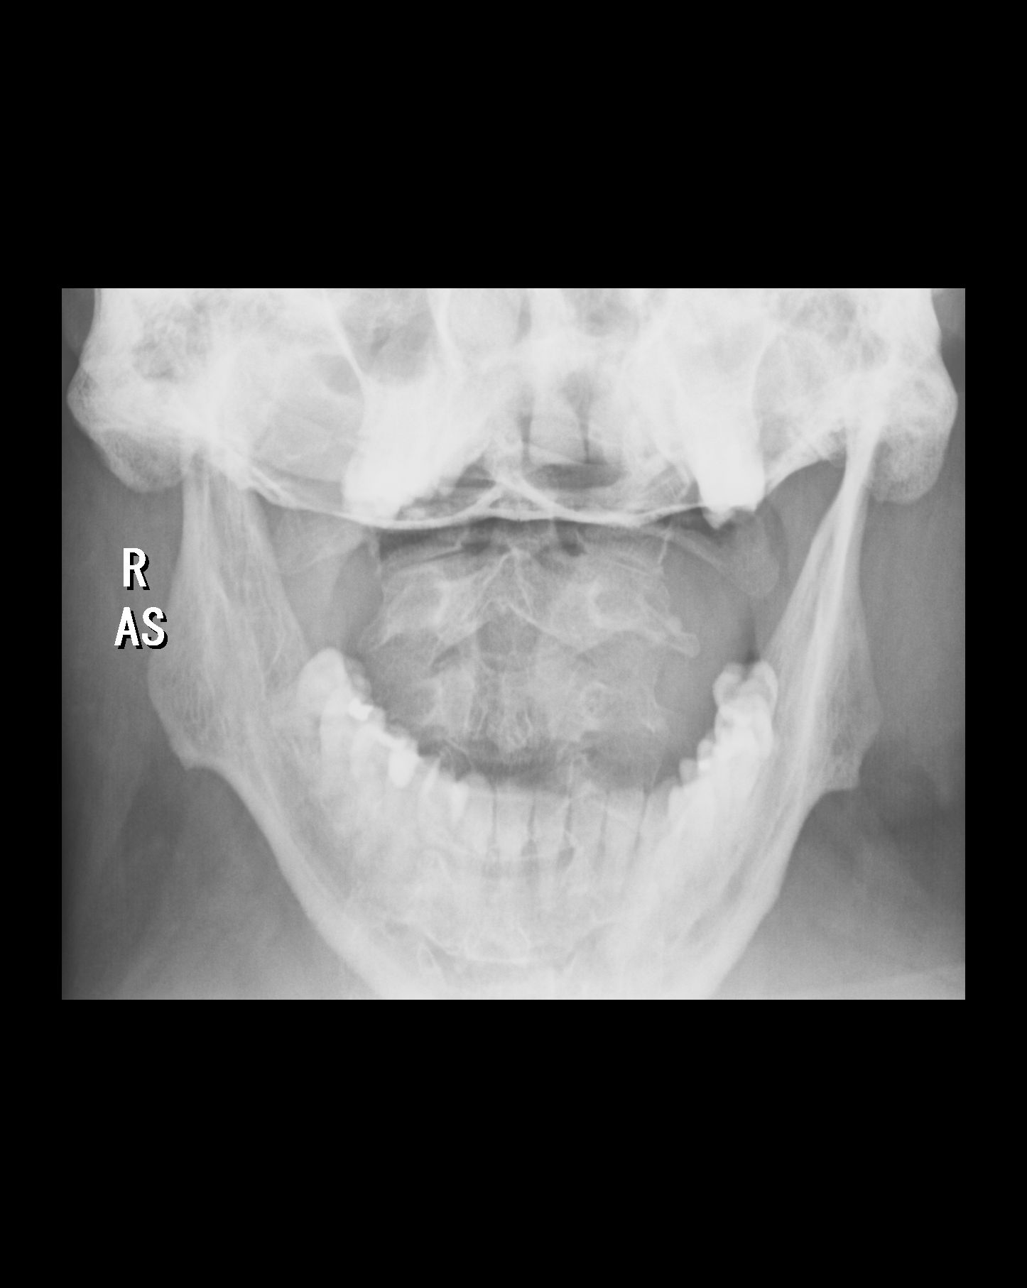

[4 of 4 positions shown; findings below may reference images not displayed]

FINDINGS: Straightening of normal lordosis without listhesis. Endplate
spurring at C2-C3, C3-C4, and C5-C6. There is mild diffuse disc
space narrowing. Lateral masses of C1 well aligned on C2. No
evidence of fracture or bony destruction. No prevertebral soft
tissue edema.
IMPRESSION: 1. Mild diffuse degenerative disc disease.
2. Straightening of normal lordosis, can be seen with muscle spasm
or positioning.

## 2021-06-26 ENCOUNTER — Ambulatory Visit: Payer: BC Managed Care – PPO | Admitting: Internal Medicine

## 2021-07-10 ENCOUNTER — Other Ambulatory Visit: Payer: Self-pay

## 2021-07-10 ENCOUNTER — Ambulatory Visit (INDEPENDENT_AMBULATORY_CARE_PROVIDER_SITE_OTHER): Payer: BC Managed Care – PPO | Admitting: Internal Medicine

## 2021-07-10 ENCOUNTER — Encounter: Payer: Self-pay | Admitting: Internal Medicine

## 2021-07-10 VITALS — BP 120/70 | HR 82 | Ht 69.0 in | Wt >= 6400 oz

## 2021-07-10 DIAGNOSIS — E785 Hyperlipidemia, unspecified: Secondary | ICD-10-CM

## 2021-07-10 DIAGNOSIS — E119 Type 2 diabetes mellitus without complications: Secondary | ICD-10-CM | POA: Diagnosis not present

## 2021-07-10 DIAGNOSIS — E291 Testicular hypofunction: Secondary | ICD-10-CM | POA: Diagnosis not present

## 2021-07-10 LAB — BASIC METABOLIC PANEL
BUN: 29 mg/dL — ABNORMAL HIGH (ref 6–23)
CO2: 25 mEq/L (ref 19–32)
Calcium: 8.9 mg/dL (ref 8.4–10.5)
Chloride: 105 mEq/L (ref 96–112)
Creatinine, Ser: 1.12 mg/dL (ref 0.40–1.50)
GFR: 79.38 mL/min (ref >60.00)
Glucose, Bld: 102 mg/dL — ABNORMAL HIGH (ref 70–99)
Potassium: 4.5 mEq/L (ref 3.5–5.1)
Sodium: 138 mEq/L (ref 135–145)

## 2021-07-10 LAB — POCT GLYCOSYLATED HEMOGLOBIN (HGB A1C): Hemoglobin A1C: 6.3 % — AB (ref 4.0–5.6)

## 2021-07-10 LAB — TESTOSTERONE: Testosterone: 429.98 ng/dL (ref 300.00–890.00)

## 2021-07-10 MED ORDER — CLOMIPHENE CITRATE 50 MG PO TABS: 50.0000 mg | ORAL_TABLET | Freq: Every day | ORAL | 3 refills | Status: DC

## 2021-07-10 MED ORDER — METFORMIN HCL 500 MG PO TABS: 1000.0000 mg | ORAL_TABLET | Freq: Two times a day (BID) | ORAL | 3 refills | Status: DC

## 2021-07-10 MED ORDER — GLIMEPIRIDE 2 MG PO TABS: 2.0000 mg | ORAL_TABLET | Freq: Every day | ORAL | 3 refills | Status: DC

## 2021-07-10 NOTE — Progress Notes (Signed)
Name: Scott Franklin  Age/ Sex: 46 y.o., male   MRN/ DOB: [id number], 02-11-1976     PCP: Delma Officer, PA   Reason for Endocrinology Evaluation: Type 2 Diabetes Mellitus/ Hypogonadism  Initial Endocrine Consultative Visit: 11/05/2020    PATIENT IDENTIFIER: [name] is a 46 y.o. male with a past medical history of T2DM, OSA on CPAP ,HTN, Obesity and hypogonadism. The patient has followed with Endocrinology clinic since 11/05/2020 for consultative assistance with management of his diabetes.  DIABETIC HISTORY:  Scott Franklin was diagnosed with DM in  08/2019. His hemoglobin A1c was 11.2%     No pancreatitis    Stopped levemir 10/2020    HYPOGONADISM HISTORY: He was diagnosed with idiopathic central hypogonadism in 2008. He was treated with Clomid at the time . CT head 08/2020 was unremarkable   He saw Dr. Everardo All in 2017 but was lost to follow up until his return to our office in 10/2020    Restarted Clomiphene 11/2020    No marijuana products  No chronic narcotic use  Denies breast tissue  Great grandfather with hx of prostate ca    Works for [employer]  SUBJECTIVE:   During the last visit (02/13/2021): A1c 5.6 % we  continued Metformin and Glimepiride and  start clomiphene      Today (07/10/2021): Scott Franklin is here for a follow up on diabetes and hypogonadism.  He checks his blood sugars 1 -3 times daily. The patient has not had hypoglycemic episodes since the last clinic visit.    Denies vomiting but has occasional nausea or diarrhea with consuming fattier foods   He has noted in the morning has an after image when moving arms ?,  Saw ophthalmology 04/2021  He has the urge to take extra glimepiride with fasting BG of 170 mg/DL, we discussed that this will increase risk of hypoglycemia and also make him short at the end of the month on the prescription.  I have recommended for that morning that he may reduce his carb intake by 50%, to eliminate  the need to double up on glimepiride  Tolerating clomiphene without any side effects   HOME DIABETES REGIMEN:  Metformin 500 mg 2 tabs BID  Glimepiride 2  Mg ,1  tab daily  Clomiphene 50 mg daily        Statin: yes ACE-I/ARB: yes Prior Diabetic Education: yes    METER DOWNLOAD SUMMARY: 1/7 - 07/10/2021  Average Number Tests/Day = 12 Overall Mean FS Glucose = 144 Standard Deviation = 42  BG Ranges: Low = 94 High = 216   Hypoglycemic Events/30 Days: BG < 50 = 0 Episodes of symptomatic severe hypoglycemia = 0     DIABETIC COMPLICATIONS: Microvascular complications:  Left retinal detachment - not related to diabetes  Denies: retinopathy, neuropathy, CKD Last Eye Exam: Completed 04/21/2021  Macrovascular complications:   Denies: CAD, CVA, PVD   HISTORY:  Past Medical History:  Past Medical History:  Diagnosis Date   Anemia    Cellulitis    Depression    Diabetes mellitus without complication (HCC)    ED (erectile dysfunction)    Hypertension    Obesity    Other testicular hypofunction    Panhypopituitarism (HCC)    Sebaceous cyst    Testosterone deficiency    Past Surgical History: No past surgical history on file. Social History:  reports that he quit smoking about 24 years ago. His smoking use included cigarettes. He has a  1.50 pack-year smoking history. He has never used smokeless tobacco. He reports that he does not drink alcohol and does not use drugs. Family History:  Family History  Problem Relation Age of Onset   Hypertension Father    Heart attack Father    Hypertrophic cardiomyopathy Father    Healthy Mother    Diabetes Neg Hx      HOME MEDICATIONS: Allergies as of 07/10/2021       Reactions   Amoxicillin Anaphylaxis, Swelling, Other (See Comments)   Throat swells   Other Itching, Rash, Other (See Comments)   Perfumes and dyes from soap Eyes burn and itch with use of ophthalmic solutions containing preservatives   Soap Rash,  Other (See Comments)   Sensitive to soap and detergent   Corn Oil Other (See Comments)   Pt reports experiencing emotional distress in response to exposure   Corn Starch Other (See Comments)   Pt reports experiencing emotional distress in response to exposure        Medication List        Accurate as of July 10, 2021 11:09 AM. If you have any questions, ask your nurse or doctor.          STOP taking these medications    Accu-Chek Guide w/Device Kit Stopped by: Scarlette Shorts, MD       TAKE these medications    atorvastatin 80 MG tablet Commonly known as: LIPITOR TAKE 1 TABLET (80 MG TOTAL) BY MOUTH DAILY. What changed: Another medication with the same name was removed. Continue taking this medication, and follow the directions you see here. Changed by: Scarlette Shorts, MD   clomiPHENE 50 MG tablet Commonly known as: CLOMID Take 1 tablet (50 mg total) by mouth daily.   fluorometholone 0.1 % ophthalmic suspension Commonly known as: FML Place 1 drop into the left eye in the morning.   glimepiride 2 MG tablet Commonly known as: AMARYL Take 1 tablet (2 mg total) by mouth daily with breakfast.   hydrochlorothiazide 12.5 MG tablet Commonly known as: HYDRODIURIL TAKE 1 TABLET (12.5 MG TOTAL) BY MOUTH DAILY. What changed: Another medication with the same name was removed. Continue taking this medication, and follow the directions you see here. Changed by: Scarlette Shorts, MD   lisinopril 10 MG tablet Commonly known as: ZESTRIL TAKE 1 TABLET (10 MG TOTAL) BY MOUTH DAILY. What changed: Another medication with the same name was removed. Continue taking this medication, and follow the directions you see here. Changed by: Scarlette Shorts, MD   metFORMIN 500 MG tablet Commonly known as: GLUCOPHAGE TAKE 2 TABLETS(1000 MG) BY MOUTH TWICE DAILY WITH A MEAL   multivitamin Tabs tablet Take 1 tablet by mouth daily with breakfast.   OneTouch Delica  Plus Lancet33G Misc 4 (four) times daily. as directed   triamcinolone cream 0.1 % Commonly known as: KENALOG Apply 1 application topically 2 (two) times daily as needed (flares).         OBJECTIVE:   Vital Signs: BP 120/70 (BP Location: Left Arm, Patient Position: Sitting, Cuff Size: Large)    Pulse 82    Ht 5\' 9"  (1.753 m)    Wt (!) 431 lb (195.5 kg)    SpO2 97%    BMI 63.65 kg/m   Wt Readings from Last 3 Encounters:  07/10/21 (!) 431 lb (195.5 kg)  02/13/21 (!) 394 lb (178.7 kg)  11/05/20 (!) 422 lb 2 oz (191.5 kg)     Exam:  General: Pt appears well and is in NAD  Lungs: Clear with good BS bilat with no rales, rhonchi, or wheezes  Heart: RRR   Extremities: No pretibial edema.   Neuro: MS is good with appropriate affect, pt is alert and Ox3   DM Foot Exam:07/10/2021   The skin of the feet is intact without sores or ulcerations. The pedal pulses are 2+ on right and 2+ on left. The sensation is intact to a screening 5.07, 10 gram monofilament bilaterally   DATA REVIEWED:  Lab Results  Component Value Date   HGBA1C 6.3 (A) 07/10/2021   HGBA1C 11.2 (H) 08/28/2020   HGBA1C 6.1 04/06/2012     Latest Reference Range & Units 07/10/21 09:41  Sodium 135 - 145 mEq/L 138  Potassium 3.5 - 5.1 mEq/L 4.5  Chloride 96 - 112 mEq/L 105  CO2 19 - 32 mEq/L 25  Glucose 70 - 99 mg/dL 161 (H)  BUN 6 - 23 mg/dL 29 (H)  Creatinine 0.96 - 1.50 mg/dL 0.45  Calcium 8.4 - 40.9 mg/dL 8.9  GFR >81.19 mL/min 79.38     Latest Reference Range & Units 07/10/21 09:41  Testosterone 300.00 - 890.00 ng/dL 147.82       9/56/2130 Serum testosterone 166 ng/dL ( 865-784) Free testosterone 6.3 pg/mL (6.8-21.5)     ASSESSMENT / PLAN / RECOMMENDATIONS:   1) Type 2 Diabetes Mellitus, optimally Controlled , Without complications - Most recent A1c of 6.3  %. Goal A1c < 7.0 %.    -His glycemic control continues to be optimal -He has been noted with weight gain, this is due to the holidays,  he is motivated to work on weight loss again -We discussed glycemic agents with weight loss benefits such as SGLT2 inhibitors, GLP-1 inhibitors and mounjaro , he would like to postpone this for now -No changes at this time -BMP normal   MEDICATIONS: -Continue metformin 500 mg 2 tablets twice daily  -Continue glimepiride 2 mg, 1 tablet before Breakfast   EDUCATION / INSTRUCTIONS: BG monitoring instructions: Patient is instructed to check his blood sugars 3 times a day, before meals . Call [facility] clinic if: BG persistently < 70  I reviewed the Rule of 15 for the treatment of hypoglycemia in detail with the patient. Literature supplied.   2) Diabetic complications:  Eye: Does not have known diabetic retinopathy.  Neuro/ Feet: Does not have known diabetic peripheral neuropathy .  Renal: Patient does not have known baseline CKD. He   is  on an ACEI/ARB at present.    3) Hypogonadism:   -He has been diagnosed with hypogonadotrophic hypogonadism in ~2008 requiring Clomiphene. -LH is inappropriately normal, prolactin level is normal, PSA normal 2022 -Testosterone level is is normal and has trended up  Medication Continue clomiphene 50 mg daily    5) Dyslipidemia:   -LDL has been trending down  Medication continue Lipitor 80 mg daily    F/U in 6 months    Signed electronically by: Lyndle Herrlich, MD  [facility] Endocrinology  [facility] [address] [place] [place], [place] [address] Phone: [phone] FAX: [phone]   CC: Delma Officer, [place] [address] [place] [address] Phone: [phone]  Fax: [phone]  Return to Endocrinology clinic as below: Future Appointments  Date Time Provider Department Center  01/08/2022  9:30 AM Cerissa Zeiger, Konrad Dolores, MD [id number] None

## 2021-07-10 NOTE — Patient Instructions (Addendum)
°-   Continue  Metformin 500 mg 2 tablets twice daily  - Continue Glimepiride 2 mg, ONe tablet before Breakfast  - Continue Clomiphene 50 mg daily     HOW TO TREAT LOW BLOOD SUGARS (Blood sugar LESS THAN 70 MG/DL) Please follow the RULE OF 15 for the treatment of hypoglycemia treatment (when your (blood sugars are less than 70 mg/dL)   STEP 1: Take 15 grams of carbohydrates when your blood sugar is low, which includes:  3-4 GLUCOSE TABS  OR 3-4 OZ OF JUICE OR REGULAR SODA OR ONE TUBE OF GLUCOSE GEL    STEP 2: RECHECK blood sugar in 15 MINUTES STEP 3: If your blood sugar is still low at the 15 minute recheck --> then, go back to STEP 1 and treat AGAIN with another 15 grams of carbohydrates.

## 2021-12-10 DIAGNOSIS — H33321 Round hole, right eye: Secondary | ICD-10-CM | POA: Diagnosis not present

## 2021-12-10 DIAGNOSIS — Z8669 Personal history of other diseases of the nervous system and sense organs: Secondary | ICD-10-CM | POA: Diagnosis not present

## 2021-12-10 DIAGNOSIS — Z9889 Other specified postprocedural states: Secondary | ICD-10-CM | POA: Diagnosis not present

## 2021-12-10 DIAGNOSIS — H15833 Staphyloma posticum, bilateral: Secondary | ICD-10-CM | POA: Diagnosis not present

## 2021-12-10 DIAGNOSIS — H442A3 Degenerative myopia with choroidal neovascularization, bilateral eye: Secondary | ICD-10-CM | POA: Diagnosis not present

## 2021-12-21 DIAGNOSIS — U071 COVID-19: Secondary | ICD-10-CM | POA: Diagnosis not present

## 2022-01-08 ENCOUNTER — Ambulatory Visit: Payer: BC Managed Care – PPO | Admitting: Internal Medicine

## 2022-01-22 ENCOUNTER — Other Ambulatory Visit: Payer: Self-pay | Admitting: Internal Medicine

## 2022-02-02 ENCOUNTER — Other Ambulatory Visit: Payer: Self-pay | Admitting: Internal Medicine

## 2022-03-10 ENCOUNTER — Other Ambulatory Visit: Payer: Self-pay

## 2022-03-10 MED ORDER — CLOMIPHENE CITRATE 50 MG PO TABS: 50.0000 mg | ORAL_TABLET | Freq: Every day | ORAL | 0 refills | Status: DC

## 2022-03-12 ENCOUNTER — Other Ambulatory Visit: Payer: Self-pay

## 2022-03-12 ENCOUNTER — Telehealth: Payer: Self-pay

## 2022-03-12 ENCOUNTER — Other Ambulatory Visit (HOSPITAL_COMMUNITY): Payer: Self-pay

## 2022-03-12 MED ORDER — CLOMIPHENE CITRATE 50 MG PO TABS: 50.0000 mg | ORAL_TABLET | Freq: Every day | ORAL | 0 refills | Status: DC

## 2022-03-12 NOTE — Telephone Encounter (Addendum)
Patient Advocate Encounter   Received notification that prior authorization for clomiPHENE Citrate 50MG  tablets is required/requested.    PA submitted on 03/12/22 to [place] via CoverMyMeds Key [id number] Status is pending

## 2022-03-12 NOTE — Telephone Encounter (Signed)
Submitted a Prior Authorization request to [facility] for Clomid 50MG  tablets via CoverMyMeds. Will update once we receive a response.   Key: [id number]

## 2022-03-12 NOTE — Telephone Encounter (Signed)
Patient called and states that he needs PA on Clomiphene.

## 2022-03-12 NOTE — Telephone Encounter (Signed)
Patient Advocate Encounter   Received notification that prior authorization for clomiPHENE Citrate 50MG  tablets is required/requested.    PA submitted on 03/12/22 to [place] via [place] Status is pending

## 2022-03-12 NOTE — Telephone Encounter (Signed)
Patient Advocate Encounter   Received notification that prior authorization for Clomid 50MG  tablets is required/requested.    Unable to process prior authorization through CoverMyMeds.   Prescription must be faxed to [facility] at [phone]. Their phone number is [phone]   Key [id number]

## 2022-03-15 ENCOUNTER — Telehealth: Payer: Self-pay

## 2022-03-15 ENCOUNTER — Other Ambulatory Visit (HOSPITAL_COMMUNITY): Payer: Self-pay

## 2022-03-15 NOTE — Telephone Encounter (Signed)
Patient called very upset because the prior authorization has not been done. I advised patient that per PA team the were unable to process through cover my meds and to send script to [facility]. Patient started yelling and stating that they already have script and need PA. Patient went on to tell me that I was unable of doing the job and I couldn't tell him why the prior authorization was unable to submit PA. I advise patient that I would reach out to the team and find out why they were unable to process. I spoke with Armanda Magic and she will have to call insurance and see what needs to be done as far as the prior authorization. She will contact patient today once she finds out what insurance needs.

## 2022-03-15 NOTE — Telephone Encounter (Signed)
Spoke with a rep at [employer]. They found the RX. No PA needed. Initially they said a new prescription was needed.  Called and left a HIPAA compliant VM for the pt, letting him know that they would be contacting him directly once the Nurse reviewed his prescription.

## 2022-05-21 DIAGNOSIS — E559 Vitamin D deficiency, unspecified: Secondary | ICD-10-CM | POA: Diagnosis not present

## 2022-05-21 DIAGNOSIS — E1165 Type 2 diabetes mellitus with hyperglycemia: Secondary | ICD-10-CM | POA: Diagnosis not present

## 2022-05-21 DIAGNOSIS — Z0000 Encounter for general adult medical examination without abnormal findings: Secondary | ICD-10-CM | POA: Diagnosis not present

## 2022-05-21 DIAGNOSIS — I10 Essential (primary) hypertension: Secondary | ICD-10-CM | POA: Diagnosis not present

## 2022-05-21 DIAGNOSIS — E7800 Pure hypercholesterolemia, unspecified: Secondary | ICD-10-CM | POA: Diagnosis not present

## 2022-05-26 ENCOUNTER — Encounter: Payer: Self-pay | Admitting: Internal Medicine

## 2022-05-26 ENCOUNTER — Ambulatory Visit (INDEPENDENT_AMBULATORY_CARE_PROVIDER_SITE_OTHER): Payer: BC Managed Care – PPO | Admitting: Internal Medicine

## 2022-05-26 VITALS — BP 120/70 | HR 95 | Ht 69.0 in | Wt >= 6400 oz

## 2022-05-26 DIAGNOSIS — E785 Hyperlipidemia, unspecified: Secondary | ICD-10-CM | POA: Diagnosis not present

## 2022-05-26 DIAGNOSIS — E119 Type 2 diabetes mellitus without complications: Secondary | ICD-10-CM | POA: Diagnosis not present

## 2022-05-26 DIAGNOSIS — E291 Testicular hypofunction: Secondary | ICD-10-CM | POA: Diagnosis not present

## 2022-05-26 LAB — POCT GLUCOSE (DEVICE FOR HOME USE): POC Glucose: 247 mg/dl — AB (ref 70–99)

## 2022-05-26 MED ORDER — GLIMEPIRIDE 2 MG PO TABS: 2.0000 mg | ORAL_TABLET | Freq: Every day | ORAL | 3 refills | Status: DC

## 2022-05-26 MED ORDER — METFORMIN HCL 500 MG PO TABS: 1000.0000 mg | ORAL_TABLET | Freq: Two times a day (BID) | ORAL | 3 refills | Status: DC

## 2022-05-26 MED ORDER — TIRZEPATIDE 2.5 MG/0.5ML ~~LOC~~ SOAJ: 2.5000 mg | SUBCUTANEOUS | 3 refills | Status: DC

## 2022-05-26 NOTE — Patient Instructions (Signed)
-   Continue  Metformin 500 mg 2 tablets twice daily  - Continue Glimepiride 2 mg, ONe tablet before Breakfast  - Start Mounjaro 2.5 mg once weekly      HOW TO TREAT LOW BLOOD SUGARS (Blood sugar LESS THAN 70 MG/DL) Please follow the RULE OF 15 for the treatment of hypoglycemia treatment (when your (blood sugars are less than 70 mg/dL)   STEP 1: Take 15 grams of carbohydrates when your blood sugar is low, which includes:  3-4 GLUCOSE TABS  OR 3-4 OZ OF JUICE OR REGULAR SODA OR ONE TUBE OF GLUCOSE GEL    STEP 2: RECHECK blood sugar in 15 MINUTES STEP 3: If your blood sugar is still low at the 15 minute recheck --> then, go back to STEP 1 and treat AGAIN with another 15 grams of carbohydrates.

## 2022-05-26 NOTE — Progress Notes (Signed)
Name: Scott Franklin  Age/ Sex: 46 y.o., male   MRN/ DOB: [id number], [birth year]     PCP: Scott Officer, PA   Reason for Endocrinology Evaluation: Type 2 Diabetes Mellitus/ Hypogonadism  Initial Endocrine Consultative Visit: 11/05/2020    PATIENT IDENTIFIER: [name] is a 46 y.o. male with a past medical history of T2DM, OSA on CPAP ,HTN, Obesity and hypogonadism. The patient has followed with Endocrinology clinic since 11/05/2020 for consultative assistance with management of his diabetes.  DIABETIC HISTORY:  [name] was diagnosed with DM in  08/2019. His hemoglobin A1c was 11.2%     No pancreatitis    Stopped levemir 10/2020    HYPOGONADISM HISTORY: He was diagnosed with idiopathic central hypogonadism in 2008. He was treated with Clomid at the time . CT head 08/2020 was unremarkable   He saw Dr. Everardo Franklin in 2017 but was lost to follow up until his return to our office in 10/2020    Restarted Clomiphene 11/2020    No marijuana products  No chronic narcotic use  Denies breast tissue  Great grandfather with hx of prostate ca    Works for [employer]  SUBJECTIVE:   During the last visit (07/10/2021): A1c 6.3 %      Today (05/26/2022): [name] is here for a follow up on diabetes and hypogonadism.  He has been checking glucose occasionally. The patient has not had hypoglycemic episodes since the last clinic visit.    Tolerating clomiphene without any side effects   HOME DIABETES REGIMEN:  Metformin 500 mg 2 tabs BID - taking 1 tab BID  Glimepiride 2  Mg ,1  tab daily  Clomiphene 50 mg daily        Statin: yes ACE-I/ARB: yes Prior Diabetic Education: yes    METER DOWNLOAD SUMMARY: 1/7 - 07/10/2021  Average Number Tests/Day = 12 Overall Mean FS Glucose = 144 Standard Deviation = 42  BG Ranges: Low = 94 High = 216   Hypoglycemic Events/30 Days: BG < 50 = 0 Episodes of symptomatic severe hypoglycemia = 0     DIABETIC  COMPLICATIONS: Microvascular complications:  Left retinal detachment - not related to diabetes  Denies: retinopathy, neuropathy, CKD Last Eye Exam: Completed 04/21/2021  Macrovascular complications:   Denies: CAD, CVA, PVD   HISTORY:  Past Medical History:  Past Medical History:  Diagnosis Date   Anemia    Cellulitis    Depression    Diabetes mellitus without complication (HCC)    ED (erectile dysfunction)    Hypertension    Obesity    Other testicular hypofunction    Panhypopituitarism (HCC)    Sebaceous cyst    Testosterone deficiency    Past Surgical History: No past surgical history on file. Social History:  reports that he quit smoking about 24 years ago. His smoking use included cigarettes. He has a 1.50 pack-year smoking history. He has never used smokeless tobacco. He reports that he does not drink alcohol and does not use drugs. Family History:  Family History  Problem Relation Age of Onset   Hypertension Father    Heart attack Father    Hypertrophic cardiomyopathy Father    Healthy Mother    Diabetes Neg Hx      HOME MEDICATIONS: Allergies as of 05/26/2022       Reactions   Amoxicillin Anaphylaxis, Swelling, Other (See Comments)   Throat swells   Other Itching, Rash, Other (See Comments)   Perfumes and dyes  from soap Eyes burn and itch with use of ophthalmic solutions containing preservatives   Soap Rash, Other (See Comments)   Sensitive to soap and detergent   Corn Oil Other (See Comments)   Pt reports experiencing emotional distress in response to exposure   Corn Starch Other (See Comments)   Pt reports experiencing emotional distress in response to exposure        Medication List        Accurate as of May 26, 2022 11:38 AM. If you have any questions, ask your nurse or doctor.          atorvastatin 80 MG tablet Commonly known as: LIPITOR TAKE 1 TABLET (80 MG TOTAL) BY MOUTH DAILY.   cholecalciferol 25 MCG (1000 UNIT)  tablet Commonly known as: VITAMIN D3 Take 1,000 Units by mouth daily.   clomiPHENE 50 MG tablet Commonly known as: CLOMID Take 1 tablet (50 mg total) by mouth daily.   fluorometholone 0.1 % ophthalmic suspension Commonly known as: FML Place 1 drop into the left eye in the morning.   glimepiride 2 MG tablet Commonly known as: AMARYL Take 1 tablet (2 mg total) by mouth daily with breakfast.   hydrochlorothiazide 12.5 MG tablet Commonly known as: HYDRODIURIL TAKE 1 TABLET (12.5 MG TOTAL) BY MOUTH DAILY.   lisinopril 10 MG tablet Commonly known as: ZESTRIL TAKE 1 TABLET (10 MG TOTAL) BY MOUTH DAILY.   metFORMIN 500 MG tablet Commonly known as: GLUCOPHAGE Take 2 tablets (1,000 mg total) by mouth 2 (two) times daily with a meal.   OneTouch Delica Plus Lancet33G Misc USE AS DIRECTED FOUR TIMES DAILY   OneTouch Verio test strip Generic drug: glucose blood USE AS DIRECTED FOUR TIMES DAILY   triamcinolone cream 0.1 % Commonly known as: KENALOG Apply 1 application topically 2 (two) times daily as needed (flares).         OBJECTIVE:   Vital Signs: BP 120/70 (BP Location: Left Arm, Patient Position: Sitting, Cuff Size: Large)   Pulse 95   Ht 5\' 9"  (1.753 m)   Wt (!) 474 lb (215 kg)   SpO2 96%   BMI 70.00 kg/m   Wt Readings from Last 3 Encounters:  05/26/22 (!) 474 lb (215 kg)  07/10/21 (!) 431 lb (195.5 kg)  02/13/21 (!) 394 lb (178.7 kg)     Exam: General: Pt appears well and is in NAD  Lungs: Clear with good BS bilat with no rales, rhonchi, or wheezes  Heart: RRR   Extremities: No pretibial edema.   Neuro: MS is good with appropriate affect, pt is alert and Ox3   DM Foot Exam:07/10/2021   The skin of the feet is intact without sores or ulcerations. The pedal pulses are 2+ on right and 2+ on left. The sensation is intact to a screening 5.07, 10 gram monofilament bilaterally   DATA REVIEWED:  Lab Results  Component Value Date   HGBA1C 6.3 (A)  07/10/2021   HGBA1C 11.2 (H) 08/28/2020   HGBA1C 6.1 04/06/2012    05/21/2022 through [facility]  A1c 9.3% BUN 20 CR 1.06 GFR 87 K4.6 CA 8.7 MA/CR ratio 15.1 TG 123 HDL 38 LDL 77   09/10/2020 Serum testosterone 166 ng/dL ( 09/12/2020) Free testosterone 6.3 pg/mL (6.8-21.5)   In office BG 247 mg/dL   ASSESSMENT / PLAN / RECOMMENDATIONS:   1) Type 2 Diabetes Mellitus, Poorly  Controlled , Without complications - Most recent A1c of 9.3  %. Goal A1c < 7.0 %.     -  Unfortunately he has been noted with hyperglycemia, A1c increased from 6.3% to 9.3 %, this is due to dietary indiscretions.  -  We again discussed add-on therapy with  SGLT2 inhibitors, GLP-1 inhibitors and mounjaro.  Patient agreed to start [facility] at this time, cautioned against GI side effects -He was given a coupon today  MEDICATIONS: -Continue metformin 500 mg 2 tablets twice daily  -Continue glimepiride 2 mg, 1 tablet before Breakfast  -Start Mounjaro 2.5 mg weekly   EDUCATION / INSTRUCTIONS: BG monitoring instructions: Patient is instructed to check his blood sugars 3 times a day, before meals . Call [facility] End[facility]in[facility] clinic if: BG persistently < 70  I reviewed the Rule of 15 for the treatment of hypoglycemia in detail with the patient. Literature supplied.   2) Diabetic complications:  Eye: Does not have known diabetic retinopathy.  Neuro/ Feet: Does not have known diabetic peripheral neuropathy .  Renal: Patient does not have known baseline CKD. He   is  on an ACEI/ARB at present.    3) Hypogonadism:   -He has been diagnosed with hypogonadotrophic hypogonadism in ~2008 requiring Clomiphene. -LH is inappropriately normal, prolactin level is normal, PSA normal 2022 -Testosterone level has been in the  normal range  Medication Continue clomiphene 50 mg daily    5) Dyslipidemia:   -LDL has been above goal as well as triglycerides -If this remains elevated, will consider switching to  rosuvastatin    Medication continue Lipitor 80 mg daily    F/U in 3 months    Signed electronically by: Lyndle Herrlich, MD  [facility] Endocrin[facility]  [facility] [address] [place] [place], [place] [address] Phone: [phone] FAX: [phone]   CC: Scott Franklin, [place] [address] [place] [address] Phone: [phone]  Fax: [phone]  Return to Endocrinology clinic as below: No future appointments.

## 2022-07-01 DIAGNOSIS — Z9889 Other specified postprocedural states: Secondary | ICD-10-CM | POA: Diagnosis not present

## 2022-07-01 DIAGNOSIS — Z947 Corneal transplant status: Secondary | ICD-10-CM | POA: Diagnosis not present

## 2022-07-01 DIAGNOSIS — H442A2 Degenerative myopia with choroidal neovascularization, left eye: Secondary | ICD-10-CM | POA: Diagnosis not present

## 2022-07-01 DIAGNOSIS — H43811 Vitreous degeneration, right eye: Secondary | ICD-10-CM | POA: Diagnosis not present

## 2022-07-01 DIAGNOSIS — H442A3 Degenerative myopia with choroidal neovascularization, bilateral eye: Secondary | ICD-10-CM | POA: Diagnosis not present

## 2022-07-01 DIAGNOSIS — H33321 Round hole, right eye: Secondary | ICD-10-CM | POA: Diagnosis not present

## 2022-07-30 DIAGNOSIS — Z87891 Personal history of nicotine dependence: Secondary | ICD-10-CM | POA: Diagnosis not present

## 2022-07-30 DIAGNOSIS — T183XXA Foreign body in small intestine, initial encounter: Secondary | ICD-10-CM | POA: Diagnosis not present

## 2022-07-30 DIAGNOSIS — D649 Anemia, unspecified: Secondary | ICD-10-CM | POA: Diagnosis not present

## 2022-07-30 DIAGNOSIS — Z6841 Body Mass Index (BMI) 40.0 and over, adult: Secondary | ICD-10-CM | POA: Diagnosis not present

## 2022-07-30 DIAGNOSIS — E669 Obesity, unspecified: Secondary | ICD-10-CM | POA: Diagnosis not present

## 2022-07-30 DIAGNOSIS — X58XXXA Exposure to other specified factors, initial encounter: Secondary | ICD-10-CM | POA: Diagnosis not present

## 2022-07-30 DIAGNOSIS — T189XXA Foreign body of alimentary tract, part unspecified, initial encounter: Secondary | ICD-10-CM | POA: Diagnosis not present

## 2022-07-30 DIAGNOSIS — W448XXA Other foreign body entering into or through a natural orifice, initial encounter: Secondary | ICD-10-CM | POA: Diagnosis not present

## 2022-07-30 DIAGNOSIS — W449XXA Unspecified foreign body entering into or through a natural orifice, initial encounter: Secondary | ICD-10-CM | POA: Diagnosis not present

## 2022-07-30 DIAGNOSIS — D72829 Elevated white blood cell count, unspecified: Secondary | ICD-10-CM | POA: Diagnosis not present

## 2022-07-31 DIAGNOSIS — W449XXA Unspecified foreign body entering into or through a natural orifice, initial encounter: Secondary | ICD-10-CM | POA: Diagnosis not present

## 2022-07-31 DIAGNOSIS — T189XXA Foreign body of alimentary tract, part unspecified, initial encounter: Secondary | ICD-10-CM | POA: Diagnosis not present

## 2022-07-31 DIAGNOSIS — W448XXA Other foreign body entering into or through a natural orifice, initial encounter: Secondary | ICD-10-CM | POA: Diagnosis not present

## 2022-07-31 DIAGNOSIS — T183XXA Foreign body in small intestine, initial encounter: Secondary | ICD-10-CM | POA: Diagnosis not present

## 2022-08-10 ENCOUNTER — Other Ambulatory Visit: Payer: Self-pay | Admitting: Internal Medicine

## 2022-08-26 ENCOUNTER — Ambulatory Visit: Payer: BC Managed Care – PPO | Admitting: Internal Medicine

## 2022-10-12 ENCOUNTER — Encounter: Payer: Self-pay | Admitting: Internal Medicine

## 2022-10-12 ENCOUNTER — Ambulatory Visit (INDEPENDENT_AMBULATORY_CARE_PROVIDER_SITE_OTHER): Payer: BC Managed Care – PPO | Admitting: Internal Medicine

## 2022-10-12 VITALS — BP 122/70 | HR 83 | Ht 69.0 in | Wt >= 6400 oz

## 2022-10-12 DIAGNOSIS — E1165 Type 2 diabetes mellitus with hyperglycemia: Secondary | ICD-10-CM | POA: Insufficient documentation

## 2022-10-12 DIAGNOSIS — E119 Type 2 diabetes mellitus without complications: Secondary | ICD-10-CM

## 2022-10-12 DIAGNOSIS — E291 Testicular hypofunction: Secondary | ICD-10-CM

## 2022-10-12 LAB — POCT GLYCOSYLATED HEMOGLOBIN (HGB A1C): Hemoglobin A1C: 9.5 % — AB (ref 4.0–5.6)

## 2022-10-12 MED ORDER — CLOMID 50 MG PO TABS: 50.0000 mg | ORAL_TABLET | Freq: Every day | ORAL | 3 refills | Status: DC

## 2022-10-12 MED ORDER — METFORMIN HCL 500 MG PO TABS: 1000.0000 mg | ORAL_TABLET | Freq: Two times a day (BID) | ORAL | 3 refills | Status: AC

## 2022-10-12 MED ORDER — GLIMEPIRIDE 4 MG PO TABS: 4.0000 mg | ORAL_TABLET | Freq: Every day | ORAL | 3 refills | Status: AC

## 2022-10-12 NOTE — Patient Instructions (Addendum)
-   Continue  Metformin 500 mg 2 tablets twice daily  - Increase  Glimepiride 4 mg, ONe tablet before Breakfast   Check out Jardiance or Farxiga for diabetes management    HOW TO TREAT LOW BLOOD SUGARS (Blood sugar LESS THAN 70 MG/DL) Please follow the RULE OF 15 for the treatment of hypoglycemia treatment (when your (blood sugars are less than 70 mg/dL)   STEP 1: Take 15 grams of carbohydrates when your blood sugar is low, which includes:  3-4 GLUCOSE TABS  OR 3-4 OZ OF JUICE OR REGULAR SODA OR ONE TUBE OF GLUCOSE GEL    STEP 2: RECHECK blood sugar in 15 MINUTES STEP 3: If your blood sugar is still low at the 15 minute recheck --> then, go back to STEP 1 and treat AGAIN with another 15 grams of carbohydrates.

## 2022-10-12 NOTE — Progress Notes (Signed)
Name: Scott Franklin  Age/ Sex: 47 y.o., male   MRN/ DOB: [id number], Aug 16, 1975     PCP: Delma Officer, PA   Reason for Endocrinology Evaluation: Type 2 Diabetes Mellitus/ Hypogonadism  Initial Endocrine Consultative Visit: 11/05/2020    PATIENT IDENTIFIER: Scott Franklin is a 47 y.o. male with a past medical history of T2DM, OSA on CPAP ,HTN, Obesity and hypogonadism. The patient has followed with Endocrinology clinic since 11/05/2020 for consultative assistance with management of his diabetes.  DIABETIC HISTORY:  Mr. Ruesch was diagnosed with DM in  08/2019. His hemoglobin A1c was 11.2%     No pancreatitis    Stopped levemir 10/2020  Started on Almira 05/2022 with an A1c of 9.3%  HYPOGONADISM HISTORY: He was diagnosed with idiopathic central hypogonadism in 2008. He was treated with Clomid at the time . CT head 08/2020 was unremarkable   He saw Dr. Everardo All in 2017 but was lost to follow up until his return to our office in 10/2020    Restarted Clomiphene 11/2020    No marijuana products  No chronic narcotic use  Denies breast tissue  Great grandfather with hx of prostate ca    Works for [employer]  SUBJECTIVE:   During the last visit (05/26/2022): A1c 9.3 %      Today (10/12/2022): [name] is here for a follow up on diabetes and hypogonadism.  He has been checking glucose occasionally. The patient has not had hypoglycemic episodes since the last clinic visit.  Tolerating clomiphene without any side effects He did not start mounjaro because he read on the side effects and did not like   Denies nausea, vomiting or diarrhea recently , has hx of vomiting that he attribites to sensitivity to fatty foods with current medications , this is very rare.    HOME DIABETES REGIMEN:  Metformin 500 mg 2 tabs BID  Glimepiride 2  Mg ,1  tab daily  Mounjaro 2.5 mg weekly- not taking  Clomiphene 50 mg daily        Statin: yes ACE-I/ARB: yes Prior  Diabetic Education: yes    METER DOWNLOAD SUMMARY: 4/10-4/23/2024  Average Number Tests/Day = 4 Overall Mean FS Glucose = 244 Standard Deviation = 36  BG Ranges: Low = 201 High = 285   Hypoglycemic Events/30 Days: BG < 50 = 0 Episodes of symptomatic severe hypoglycemia = 0     DIABETIC COMPLICATIONS: Microvascular complications:  Left retinal detachment - not related to diabetes  Denies: retinopathy, neuropathy, CKD Last Eye Exam: Completed 07/01/2022  Macrovascular complications:   Denies: CAD, CVA, PVD   HISTORY:  Past Medical History:  Past Medical History:  Diagnosis Date   Anemia    Cellulitis    Depression    Diabetes mellitus without complication    ED (erectile dysfunction)    Hypertension    Obesity    Other testicular hypofunction    Panhypopituitarism    Sebaceous cyst    Testosterone deficiency    Past Surgical History: No past surgical history on file. Social History:  reports that he quit smoking about 25 years ago. His smoking use included cigarettes. He has a 1.50 pack-year smoking history. He has never used smokeless tobacco. He reports that he does not drink alcohol and does not use drugs. Family History:  Family History  Problem Relation Age of Onset   Hypertension Father    Heart attack Father    Hypertrophic cardiomyopathy Father    Healthy  Mother    Diabetes Neg Hx      HOME MEDICATIONS: Allergies as of 10/12/2022       Reactions   Amoxicillin Anaphylaxis, Swelling, Other (See Comments)   Throat swells   Other Itching, Rash, Other (See Comments)   Perfumes and dyes from soap Eyes burn and itch with use of ophthalmic solutions containing preservatives   Soap Rash, Other (See Comments)   Sensitive to soap and detergent   Corn Oil Other (See Comments)   Pt reports experiencing emotional distress in response to exposure   Corn Starch Other (See Comments)   Pt reports experiencing emotional distress in response to exposure         Medication List        Accurate as of October 12, 2022  1:38 PM. If you have any questions, ask your nurse or doctor.          STOP taking these medications    tirzepatide 2.5 MG/0.5ML Pen Commonly known as: MOUNJARO Stopped by: Scarlette Shorts, MD       TAKE these medications    atorvastatin 80 MG tablet Commonly known as: LIPITOR Take 80 mg by mouth daily. What changed: Another medication with the same name was removed. Continue taking this medication, and follow the directions you see here. Changed by: Scarlette Shorts, MD   cholecalciferol 25 MCG (1000 UNIT) tablet Commonly known as: VITAMIN D3 Take 1,000 Units by mouth daily.   Clomid 50 MG tablet Generic drug: clomiPHENE Take 1 tablet (50 mg total) by mouth daily. What changed: See the new instructions. Changed by: Scarlette Shorts, MD   fluorometholone 0.1 % ophthalmic suspension Commonly known as: FML Place 1 drop into the left eye in the morning.   glimepiride 4 MG tablet Commonly known as: AMARYL Take 1 tablet (4 mg total) by mouth daily before breakfast. What changed:  medication strength how much to take when to take this Changed by: Scarlette Shorts, MD   hydrochlorothiazide 12.5 MG tablet Commonly known as: HYDRODIURIL TAKE 1 TABLET (12.5 MG TOTAL) BY MOUTH DAILY.   lisinopril 10 MG tablet Commonly known as: ZESTRIL TAKE 1 TABLET (10 MG TOTAL) BY MOUTH DAILY.   metFORMIN 500 MG tablet Commonly known as: GLUCOPHAGE Take 2 tablets (1,000 mg total) by mouth 2 (two) times daily with a meal.   OneTouch Delica Plus Lancet33G Misc USE AS DIRECTED FOUR TIMES DAILY   OneTouch Verio test strip Generic drug: glucose blood USE AS DIRECTED FOUR TIMES DAILY   triamcinolone cream 0.1 % Commonly known as: KENALOG Apply 1 application topically 2 (two) times daily as needed (flares).         OBJECTIVE:   Vital Signs: BP 122/70 (BP Location: Left Arm, Patient Position:  Sitting, Cuff Size: Large)   Pulse 83   Ht  (1.753 m)   Wt (!) 480 lb (217.7 kg)   SpO2 95%   BMI 70.88 kg/m   Wt Readings from Last 3 Encounters:  10/12/22 (!) 480 lb (217.7 kg)  05/26/22 (!) 474 lb (215 kg)  07/10/21 (!) 431 lb (195.5 kg)     Exam: General: Pt appears well and is in NAD  Extremities: 1+  pretibial edema.   Neuro: MS is good with appropriate affect, pt is alert and Ox3   DM Foot Exam:10/12/2022   The skin of the feet is intact without sores or ulcerations. The pedal pulses are 2+ on right and 2+ on left.  The sensation is intact to a screening 5.07, 10 gram monofilament bilaterally   DATA REVIEWED:  Lab Results  Component Value Date   HGBA1C 9.5 (A) 10/12/2022   HGBA1C 6.3 (A) 07/10/2021   HGBA1C 11.2 (H) 08/28/2020    05/21/2022 through [facility]  A1c 9.3% BUN 20 CR 1.06 GFR 87 K4.6 CA 8.7 MA/CR ratio 15.1 TG 123 HDL 38 LDL 77   09/10/2020 Serum testosterone 166 ng/dL ( 696-295) Free testosterone 6.3 pg/mL (6.8-21.5)     ASSESSMENT / PLAN / RECOMMENDATIONS:   1) Type 2 Diabetes Mellitus, Poorly  Controlled , Without complications - Most recent A1c of 9.5  %. Goal A1c < 7.0 %.    - Pt with worsening glycemic control -He has not been able to follow a low-carb diet consistently -He declines weight loss surgery -I prescribed Mounjaro, but the patient states he read on the side effects and does not want to try any GLP-1 agonist -Limited options at this time, will increase glimepiride -I have written the Farxiga/Jardiance for him to look up, and if he is comfortable I will be happy to prescribe -Not a candidate for pioglitazone due to lower extremity edema -We discussed microvascular complications of uncontrolled diabetes, and weighing the risk versus the benefit of taking medications  MEDICATIONS: -Continue metformin 500 mg 2 tablets twice daily  -Increase glimepiride 4 mg, 1 tablet before Breakfast    EDUCATION / INSTRUCTIONS: BG  monitoring instructions: Patient is instructed to check his blood sugars 3 times a day, before meals . Call [facility] if: BG persistently < 70  I reviewed the Rule of 15 for the treatment of hypoglycemia in detail with the patient. Literature supplied.   2) Diabetic complications:  Eye: Does not have known diabetic retinopathy.  Neuro/ Feet: Does not have known diabetic peripheral neuropathy .  Renal: Patient does not have known baseline CKD. He   is  on an ACEI/ARB at present.    3) Hypogonadism:   -He has been diagnosed with hypogonadotrophic hypogonadism in ~2008 requiring Clomiphene. -LH is inappropriately normal, prolactin level is normal, PSA normal 2022 -Testosterone level has been in the  normal range  Medication Continue clomiphene 50 mg daily     F/U in 4-6 months    Signed electronically by: Lyndle Herrlich, MD  [facility] Endocrinology  [facility] [address] [place] [place], [place] [address] Phone: [phone] FAX: [phone]   CC: Delma Officer, [place] [address] [place] [address] Phone: [phone]  Fax: [phone]  Return to Endocrinology clinic as below: Future Appointments  Date Time Provider Department Center  04/01/2023 10:10 AM Eliazar Olivar, Konrad Dolores, MD [id number] None

## 2022-12-30 DIAGNOSIS — H15833 Staphyloma posticum, bilateral: Secondary | ICD-10-CM | POA: Diagnosis not present

## 2022-12-30 DIAGNOSIS — Z8669 Personal history of other diseases of the nervous system and sense organs: Secondary | ICD-10-CM | POA: Diagnosis not present

## 2022-12-30 DIAGNOSIS — H43813 Vitreous degeneration, bilateral: Secondary | ICD-10-CM | POA: Diagnosis not present

## 2022-12-30 DIAGNOSIS — H442A3 Degenerative myopia with choroidal neovascularization, bilateral eye: Secondary | ICD-10-CM | POA: Diagnosis not present

## 2023-03-23 ENCOUNTER — Other Ambulatory Visit: Payer: Self-pay | Admitting: Internal Medicine

## 2023-04-01 ENCOUNTER — Ambulatory Visit (INDEPENDENT_AMBULATORY_CARE_PROVIDER_SITE_OTHER): Payer: BC Managed Care – PPO | Admitting: Internal Medicine

## 2023-04-01 ENCOUNTER — Encounter: Payer: Self-pay | Admitting: Internal Medicine

## 2023-04-01 VITALS — BP 142/78 | HR 86 | Ht 69.0 in | Wt >= 6400 oz

## 2023-04-01 DIAGNOSIS — E291 Testicular hypofunction: Secondary | ICD-10-CM | POA: Diagnosis not present

## 2023-04-01 DIAGNOSIS — E119 Type 2 diabetes mellitus without complications: Secondary | ICD-10-CM | POA: Diagnosis not present

## 2023-04-01 DIAGNOSIS — E785 Hyperlipidemia, unspecified: Secondary | ICD-10-CM | POA: Diagnosis not present

## 2023-04-01 LAB — T4, FREE: Free T4: 0.64 ng/dL (ref 0.60–1.60)

## 2023-04-01 LAB — LIPID PANEL
Cholesterol: 135 mg/dL (ref 0–200)
HDL: 35.4 mg/dL — ABNORMAL LOW (ref >39.00)
LDL Cholesterol: 74 mg/dL (ref 0–99)
NonHDL: 99.17
Total CHOL/HDL Ratio: 4
Triglycerides: 127 mg/dL (ref 0.0–149.0)
VLDL: 25.4 mg/dL (ref 0.0–40.0)

## 2023-04-01 LAB — BASIC METABOLIC PANEL
BUN: 17 mg/dL (ref 6–23)
CO2: 25 meq/L (ref 19–32)
Calcium: 9.1 mg/dL (ref 8.4–10.5)
Chloride: 103 meq/L (ref 96–112)
Creatinine, Ser: 0.99 mg/dL (ref 0.40–1.50)
GFR: 90.94 mL/min (ref >60.00)
Glucose, Bld: 101 mg/dL — ABNORMAL HIGH (ref 70–99)
Potassium: 4 meq/L (ref 3.5–5.1)
Sodium: 139 meq/L (ref 135–145)

## 2023-04-01 LAB — POCT GLYCOSYLATED HEMOGLOBIN (HGB A1C): Hemoglobin A1C: 6.4 % — AB (ref 4.0–5.6)

## 2023-04-01 LAB — TSH: TSH: 3.48 u[IU]/mL (ref 0.35–5.50)

## 2023-04-01 LAB — MICROALBUMIN / CREATININE URINE RATIO
Creatinine,U: 327.3 mg/dL
Microalb Creat Ratio: 0.5 mg/g (ref 0.0–30.0)
Microalb, Ur: 1.7 mg/dL (ref 0.0–1.9)

## 2023-04-01 NOTE — Progress Notes (Signed)
Name: Scott Franklin  Age/ Sex: 47 y.o., male   MRN/ DOB: [id number], February 22, 1976     PCP: Delma Officer, PA   Reason for Endocrinology Evaluation: Type 2 Diabetes Mellitus/ Hypogonadism  Initial Endocrine Consultative Visit: 11/05/2020    PATIENT IDENTIFIER: Scott Franklin is a 47 y.o. male with a past medical history of T2DM, OSA on CPAP ,HTN, Obesity and hypogonadism. The patient has followed with Endocrinology clinic since 11/05/2020 for consultative assistance with management of his diabetes.  DIABETIC HISTORY:  Scott Franklin was diagnosed with DM in  08/2019. His hemoglobin A1c was 11.2%     No pancreatitis    Stopped levemir 10/2020  Attempted to prescribe Mounjaro 05/2022 with an A1c of 9.3%, but the patient did not start due to skepticism about side effects      HYPOGONADISM HISTORY: He was diagnosed with idiopathic central hypogonadism in 2008. He was treated with Clomid at the time . CT head 08/2020 was unremarkable   He saw Dr. Everardo All in 2017 but was lost to follow up until his return to our office in 10/2020    Restarted Clomiphene 11/2020    No marijuana products  No chronic narcotic use  Denies breast tissue  Great grandfather with hx of prostate ca    Works for [employer]  SUBJECTIVE:   During the last visit (10/12/2022): A1c 9.5 %      Today (04/01/2023): Scott Franklin is here for a follow up on diabetes and hypogonadism.  He has been checking glucose occasionally. The patient has had hypoglycemic episodes since the last clinic visit, he is symptomatic.   Has noted occasional constipation  Denies nausea or vomiting  Denies LE edema but noted pruritus  Denies ED  Denies SOB    HOME DIABETES REGIMEN:  Metformin 500 mg 2 tabs BID  Glimepiride 4  Mg ,1  tab daily  Clomiphene 50 mg daily        Statin: yes ACE-I/ARB: yes Prior Diabetic Education: yes    METER DOWNLOAD SUMMARY: 9/12-10/04/2023  Average Number Tests/Day =  1 Overall Mean FS Glucose = 123 Standard Deviation = 37  BG Ranges: Low = 64 High = 273   Hypoglycemic Events/30 Days: BG < 50 = 0 Episodes of symptomatic severe hypoglycemia = 0     DIABETIC COMPLICATIONS: Microvascular complications:  Left retinal detachment - not related to diabetes  Denies: retinopathy, neuropathy, CKD Last Eye Exam: Completed 07/01/2022  Macrovascular complications:   Denies: CAD, CVA, PVD   HISTORY:  Past Medical History:  Past Medical History:  Diagnosis Date   Anemia    Cellulitis    Depression    Diabetes mellitus without complication (HCC)    ED (erectile dysfunction)    Hypertension    Obesity    Other testicular hypofunction    Panhypopituitarism (HCC)    Sebaceous cyst    Testosterone deficiency    Past Surgical History: No past surgical history on file. Social History:  reports that he quit smoking about 25 years ago. His smoking use included cigarettes. He started smoking about 28 years ago. He has a 1.5 pack-year smoking history. He has never used smokeless tobacco. He reports that he does not drink alcohol and does not use drugs. Family History:  Family History  Problem Relation Age of Onset   Hypertension Father    Heart attack Father    Hypertrophic cardiomyopathy Father    Healthy Mother    Diabetes Neg Hx  HOME MEDICATIONS: Allergies as of 04/01/2023       Reactions   Amoxicillin Anaphylaxis, Swelling, Other (See Comments)   Throat swells   Other Itching, Rash, Other (See Comments)   Perfumes and dyes from soap Eyes burn and itch with use of ophthalmic solutions containing preservatives   Soap Rash, Other (See Comments)   Sensitive to soap and detergent   Corn Oil Other (See Comments)   Pt reports experiencing emotional distress in response to exposure   Corn Starch Other (See Comments)   Pt reports experiencing emotional distress in response to exposure        Medication List        Accurate as of  April 01, 2023 10:34 AM. If you have any questions, ask your nurse or doctor.          atorvastatin 80 MG tablet Commonly known as: LIPITOR Take 80 mg by mouth daily.   cholecalciferol 25 MCG (1000 UNIT) tablet Commonly known as: VITAMIN D3 Take 1,000 Units by mouth daily.   Clomid 50 MG tablet Generic drug: clomiPHENE Take 1 tablet (50 mg total) by mouth daily.   fluorometholone 0.1 % ophthalmic suspension Commonly known as: FML Place 1 drop into the left eye in the morning.   glimepiride 4 MG tablet Commonly known as: AMARYL Take 1 tablet (4 mg total) by mouth daily before breakfast.   hydrochlorothiazide 12.5 MG tablet Commonly known as: HYDRODIURIL TAKE 1 TABLET (12.5 MG TOTAL) BY MOUTH DAILY.   lisinopril 10 MG tablet Commonly known as: ZESTRIL TAKE 1 TABLET (10 MG TOTAL) BY MOUTH DAILY.   metFORMIN 500 MG tablet Commonly known as: GLUCOPHAGE Take 2 tablets (1,000 mg total) by mouth 2 (two) times daily with a meal.   OneTouch Delica Plus Lancet33G Misc USE AS DIRECTED FOUR TIMES DAILY   OneTouch Verio test strip Generic drug: glucose blood USE AS DIRECTED FOUR TIMES DAILY   triamcinolone cream 0.1 % Commonly known as: KENALOG Apply 1 application topically 2 (two) times daily as needed (flares).         OBJECTIVE:   Vital Signs: BP (!) 142/78 (BP Location: Left Arm, Patient Position: Sitting, Cuff Size: Large)   Pulse 86   Ht 5\' 9"  (1.753 m)   Wt (!) 433 lb (196.4 kg)   SpO2 93%   BMI 63.94 kg/m   Wt Readings from Last 3 Encounters:  04/01/23 (!) 433 lb (196.4 kg)  10/12/22 (!) 480 lb (217.7 kg)  05/26/22 (!) 474 lb (215 kg)     Exam: General: Pt appears well and is in NAD  Heart: RRR  Extremities: Trace  pretibial edema.   Neuro: MS is good with appropriate affect, pt is alert and Ox3   DM Foot Exam:10/12/2022   The skin of the feet is intact without sores or ulcerations. The pedal pulses are 2+ on right and 2+ on left. The  sensation is intact to a screening 5.07, 10 gram monofilament bilaterally   DATA REVIEWED:  Lab Results  Component Value Date   HGBA1C 9.5 (A) 10/12/2022   HGBA1C 6.3 (A) 07/10/2021   HGBA1C 11.2 (H) 08/28/2020    Latest Reference Range & Units 04/01/23 10:45  Sodium 135 - 145 mEq/L 139  Potassium 3.5 - 5.1 mEq/L 4.0  Chloride 96 - 112 mEq/L 103  CO2 19 - 32 mEq/L 25  Glucose 70 - 99 mg/dL 161 (H)  BUN 6 - 23 mg/dL 17  Creatinine 0.96 - 0.45 mg/dL  0.99  Calcium 8.4 - 10.5 mg/dL 9.1  GFR >62.95 mL/min 90.94    Latest Reference Range & Units 04/01/23 10:45  Total CHOL/HDL Ratio  4  Cholesterol 0 - 200 mg/dL 284  HDL Cholesterol >13.24 mg/dL 40.10 (L)  LDL (calc) 0 - 99 mg/dL 74  MICROALB/CREAT RATIO 0.0 - 30.0 mg/g 0.5  NonHDL  99.17  Triglycerides 0.0 - 149.0 mg/dL 272.5  VLDL 0.0 - 36.6 mg/dL 44.0    Latest Reference Range & Units 04/01/23 10:45  TSH 0.35 - 5.50 uIU/mL 3.48  T4,Free(Direct) 0.60 - 1.60 ng/dL 3.47    Latest Reference Range & Units 04/01/23 10:45  Creatinine,U mg/dL 425.9  Microalb, Ur 0.0 - 1.9 mg/dL 1.7  MICROALB/CREAT RATIO 0.0 - 30.0 mg/g 0.5      09/10/2020 Serum testosterone 166 ng/dL ( 563-875) Free testosterone 6.3 pg/mL (6.8-21.5)     ASSESSMENT / PLAN / RECOMMENDATIONS:   1) Type 2 Diabetes Mellitus, Optimally Controlled , Without complications - Most recent A1c of 6.4  %. Goal A1c < 7.0 %.    -I have praised the patient on weight loss as well as optimizing glucose control -He did have an episode of hypoglycemia that is attributes to nausea and vomiting -He declines weight loss surgery -I had prescribed Mounjaro, but did not start due to skepticism about side effects -Not a candidate for pioglitazone due to lower extremity edema -No changes at this time    MEDICATIONS: -Continue metformin 500 mg 2 tablets twice daily  -Continue glimepiride 4 mg, 1 tablet before Breakfast    EDUCATION / INSTRUCTIONS: BG monitoring  instructions: Patient is instructed to check his blood sugars 3 times a day, before meals . Call [place] [facility] if: BG persistently < 70  I reviewed the Rule of 15 for the treatment of hypoglycemia in detail with the patient. Literature supplied.   2) Diabetic complications:  Eye: Does not have known diabetic retinopathy.  Neuro/ Feet: Does not have known diabetic peripheral neuropathy .  Renal: Patient does not have known baseline CKD. He   is  on an ACEI/ARB at present.    3) Hypogonadism:   -He has been diagnosed with hypogonadotrophic hypogonadism in ~2008 requiring Clomiphene. -LH is inappropriately normal, prolactin level is normal, PSA normal 2022 -Testosterone level has been in the  normal range -Testosterone pending today  Medication Continue clomiphene 50 mg daily   4) Dyslipidemia :  -LDL at goal  Medication Continue atorvastatin 80 mg daily   F/U in 6 months    Signed electronically by: Lyndle Herrlich, MD  [facility] Endocrinology  [facility] [address] [place] [place], [place] [address] Phone: [phone] FAX: [phone]   CC: Delma Officer, [place] [address] [place] [address] Phone: [phone]  Fax: [phone]  Return to Endocrinology clinic as below: No future appointments.

## 2023-04-01 NOTE — Patient Instructions (Signed)
-   Continue  Metformin 500 mg 2 tablets twice daily  - Continue  Glimepiride 4 mg, ONe tablet before Breakfast    HOW TO TREAT LOW BLOOD SUGARS (Blood sugar LESS THAN 70 MG/DL) Please follow the RULE OF 15 for the treatment of hypoglycemia treatment (when your (blood sugars are less than 70 mg/dL)   STEP 1: Take 15 grams of carbohydrates when your blood sugar is low, which includes:  3-4 GLUCOSE TABS  OR 3-4 OZ OF JUICE OR REGULAR SODA OR ONE TUBE OF GLUCOSE GEL    STEP 2: RECHECK blood sugar in 15 MINUTES STEP 3: If your blood sugar is still low at the 15 minute recheck --> then, go back to STEP 1 and treat AGAIN with another 15 grams of carbohydrates.

## 2023-04-04 LAB — TESTOSTERONE, TOTAL, LC/MS/MS: Testosterone, Total, LC-MS-MS: 553 ng/dL (ref 250–1100)

## 2023-04-05 ENCOUNTER — Encounter: Payer: Self-pay | Admitting: Internal Medicine

## 2023-04-15 ENCOUNTER — Telehealth: Payer: Self-pay

## 2023-04-15 NOTE — Telephone Encounter (Signed)
Clomiphene needs PA

## 2023-04-18 ENCOUNTER — Other Ambulatory Visit: Payer: Self-pay

## 2023-04-18 ENCOUNTER — Other Ambulatory Visit: Payer: Self-pay | Admitting: Internal Medicine

## 2023-04-18 ENCOUNTER — Other Ambulatory Visit (HOSPITAL_COMMUNITY): Payer: Self-pay

## 2023-04-18 MED ORDER — CLOMID 50 MG PO TABS: 50.0000 mg | ORAL_TABLET | Freq: Every day | ORAL | 3 refills | Status: DC

## 2023-04-18 NOTE — Telephone Encounter (Signed)
Can you print this prescription out so I can send to company for PA. It will not print for me.

## 2023-04-18 NOTE — Telephone Encounter (Signed)
Needs to go to company called [employer] [facility] [employer]

## 2023-04-21 ENCOUNTER — Telehealth: Payer: Self-pay

## 2023-04-21 NOTE — Telephone Encounter (Signed)
Pharmacy Patient Advocate Encounter   Received notification from Pt Calls Messages that prior authorization for Clomid is required/requested.   Insurance verification completed.   The patient is insured through [facility] .   Per test claim: PA required; PA started via CoverMyMeds. KEY [id number] . Waiting for clinical questions to populate.

## 2023-04-21 NOTE — Telephone Encounter (Signed)
Patient states that the pharmacy is saying that he still needs PA for Clomid

## 2023-04-21 NOTE — Telephone Encounter (Signed)
I called and spoke with this patient and explained to him that I checked on CMM about his Prior auth and advised to him that it states its not needed. He proceeds to Yell at me stating we just can't do our jobs, and proceeds to be rude. Also before ending the call he states that he was going to call and report [place] to the medical board.

## 2023-04-22 NOTE — Telephone Encounter (Signed)
I have sent a message to the [facility] regarding patient and how he continues to abuse the staff verbally with profanity.  I have also advised Dr. Lonzo Cloud on patient behavior.

## 2023-04-25 ENCOUNTER — Encounter: Payer: Self-pay | Admitting: Internal Medicine

## 2023-04-25 ENCOUNTER — Other Ambulatory Visit (HOSPITAL_COMMUNITY): Payer: Self-pay

## 2023-04-25 NOTE — Telephone Encounter (Signed)
For future reference of whomever does the next PA- It has to go through [facility] [phone]) as long as he has this same plan. Pt is frustrated because of the delay it has taken sending the request to the wrong place and said it wasn't the first time it has happened.   PA has been approved. Finally updated in the ins system. Spoke with pt to inform him as well. Pharmacy Patient Advocate Encounter  Received notification from  [facility]  that Prior Authorization for Clomid has been APPROVED from 04/18/23 to 04/17/24. Ran test claim, Copay is $10. This test claim was processed through [facility]- copay amounts may vary at other pharmacies due to pharmacy/plan contracts, or as the patient moves through the different stages of their insurance plan.   PA #/Case ID/Reference #: [id number]

## 2023-04-26 ENCOUNTER — Other Ambulatory Visit: Payer: Self-pay

## 2023-04-26 MED ORDER — CLOMID 50 MG PO TABS: 50.0000 mg | ORAL_TABLET | Freq: Every day | ORAL | 3 refills | Status: AC

## 2023-04-26 NOTE — Telephone Encounter (Signed)
Prescription [facility] to [facility] as they are unable to take transfer prescription from [facility] pharmacy.

## 2023-04-27 ENCOUNTER — Other Ambulatory Visit: Payer: Self-pay

## 2023-06-28 DIAGNOSIS — Z23 Encounter for immunization: Secondary | ICD-10-CM | POA: Diagnosis not present

## 2023-06-28 DIAGNOSIS — E1165 Type 2 diabetes mellitus with hyperglycemia: Secondary | ICD-10-CM | POA: Diagnosis not present

## 2023-06-28 DIAGNOSIS — Z0000 Encounter for general adult medical examination without abnormal findings: Secondary | ICD-10-CM | POA: Diagnosis not present

## 2023-06-28 DIAGNOSIS — I10 Essential (primary) hypertension: Secondary | ICD-10-CM | POA: Diagnosis not present

## 2023-06-28 DIAGNOSIS — E118 Type 2 diabetes mellitus with unspecified complications: Secondary | ICD-10-CM | POA: Diagnosis not present

## 2023-07-07 DIAGNOSIS — H33321 Round hole, right eye: Secondary | ICD-10-CM | POA: Diagnosis not present

## 2023-07-07 DIAGNOSIS — H43811 Vitreous degeneration, right eye: Secondary | ICD-10-CM | POA: Diagnosis not present

## 2023-07-07 DIAGNOSIS — H15833 Staphyloma posticum, bilateral: Secondary | ICD-10-CM | POA: Diagnosis not present

## 2023-07-07 DIAGNOSIS — H442A2 Degenerative myopia with choroidal neovascularization, left eye: Secondary | ICD-10-CM | POA: Diagnosis not present

## 2023-07-07 DIAGNOSIS — Z9889 Other specified postprocedural states: Secondary | ICD-10-CM | POA: Diagnosis not present

## 2023-07-07 DIAGNOSIS — Z8669 Personal history of other diseases of the nervous system and sense organs: Secondary | ICD-10-CM | POA: Diagnosis not present

## 2023-07-15 DIAGNOSIS — Z01818 Encounter for other preprocedural examination: Secondary | ICD-10-CM | POA: Diagnosis not present

## 2023-07-20 DIAGNOSIS — E119 Type 2 diabetes mellitus without complications: Secondary | ICD-10-CM | POA: Diagnosis not present

## 2023-07-20 DIAGNOSIS — E559 Vitamin D deficiency, unspecified: Secondary | ICD-10-CM | POA: Diagnosis not present

## 2023-07-20 DIAGNOSIS — I10 Essential (primary) hypertension: Secondary | ICD-10-CM | POA: Diagnosis not present

## 2023-07-20 DIAGNOSIS — E291 Testicular hypofunction: Secondary | ICD-10-CM | POA: Diagnosis not present

## 2023-09-26 ENCOUNTER — Other Ambulatory Visit: Payer: Self-pay | Admitting: Gastroenterology

## 2023-10-14 ENCOUNTER — Ambulatory Visit: Payer: BC Managed Care – PPO | Admitting: Internal Medicine

## 2023-11-08 ENCOUNTER — Encounter (HOSPITAL_COMMUNITY): Payer: Self-pay | Admitting: Gastroenterology

## 2023-11-14 NOTE — Anesthesia Preprocedure Evaluation (Signed)
 Anesthesia Evaluation  Patient identified by MRN, date of birth, ID band Patient awake    Reviewed: Allergy & Precautions, NPO status , Patient's Chart, lab work & pertinent test results  History of Anesthesia Complications Negative for: history of anesthetic complications  Airway Mallampati: II  TM Distance: >3 FB Neck ROM: Full    Dental  (+) Dental Advisory Given, Chipped   Pulmonary sleep apnea and Continuous Positive Airway Pressure Ventilation , former smoker   Pulmonary exam normal        Cardiovascular hypertension, Pt. on medications Normal cardiovascular exam     Neuro/Psych  PSYCHIATRIC DISORDERS  Depression    negative neurological ROS     GI/Hepatic negative GI ROS, Neg liver ROS,,,  Endo/Other  diabetes, Type 2, Oral Hypoglycemic Agents  Class 4 obesity Panhypopituitarism   Renal/GU negative Renal ROS     Musculoskeletal negative musculoskeletal ROS (+)    Abdominal  (+) + obese  Peds  Hematology negative hematology ROS (+)   Anesthesia Other Findings   Reproductive/Obstetrics                             Anesthesia Physical Anesthesia Plan  ASA: 4  Anesthesia Plan: MAC   Post-op Pain Management: Minimal or no pain anticipated   Induction:   PONV Risk Score and Plan: 1 and Propofol  infusion and Treatment may vary due to age or medical condition  Airway Management Planned: Natural Airway and Simple Face Mask  Additional Equipment: None  Intra-op Plan:   Post-operative Plan:   Informed Consent: I have reviewed the patients History and Physical, chart, labs and discussed the procedure including the risks, benefits and alternatives for the proposed anesthesia with the patient or authorized representative who has indicated his/her understanding and acceptance.       Plan Discussed with: CRNA and Anesthesiologist  Anesthesia Plan Comments:         Anesthesia Quick Evaluation

## 2023-11-15 ENCOUNTER — Ambulatory Visit (HOSPITAL_COMMUNITY)
Admission: RE | Admit: 2023-11-15 | Discharge: 2023-11-15 | Disposition: A | Discharge status: Home / Self Care | Source: Ambulatory Visit | Attending: Gastroenterology | Admitting: Gastroenterology

## 2023-11-15 ENCOUNTER — Encounter (HOSPITAL_COMMUNITY)
Admission: RE | Disposition: A | Discharge status: Home / Self Care | Payer: Self-pay | Source: Ambulatory Visit | Attending: Gastroenterology

## 2023-11-15 ENCOUNTER — Encounter (HOSPITAL_COMMUNITY): Payer: Self-pay | Admitting: Gastroenterology

## 2023-11-15 ENCOUNTER — Other Ambulatory Visit: Payer: Self-pay

## 2023-11-15 ENCOUNTER — Ambulatory Visit (HOSPITAL_COMMUNITY): Payer: Self-pay | Admitting: Anesthesiology

## 2023-11-15 DIAGNOSIS — Z79899 Other long term (current) drug therapy: Secondary | ICD-10-CM | POA: Diagnosis not present

## 2023-11-15 DIAGNOSIS — Z83719 Family history of colon polyps, unspecified: Secondary | ICD-10-CM | POA: Insufficient documentation

## 2023-11-15 DIAGNOSIS — Q438 Other specified congenital malformations of intestine: Secondary | ICD-10-CM | POA: Insufficient documentation

## 2023-11-15 DIAGNOSIS — F32A Depression, unspecified: Secondary | ICD-10-CM | POA: Insufficient documentation

## 2023-11-15 DIAGNOSIS — E119 Type 2 diabetes mellitus without complications: Secondary | ICD-10-CM | POA: Insufficient documentation

## 2023-11-15 DIAGNOSIS — Z800 Family history of malignant neoplasm of digestive organs: Secondary | ICD-10-CM | POA: Insufficient documentation

## 2023-11-15 DIAGNOSIS — E230 Hypopituitarism: Secondary | ICD-10-CM | POA: Diagnosis not present

## 2023-11-15 DIAGNOSIS — Z1211 Encounter for screening for malignant neoplasm of colon: Secondary | ICD-10-CM | POA: Diagnosis not present

## 2023-11-15 DIAGNOSIS — K635 Polyp of colon: Secondary | ICD-10-CM | POA: Insufficient documentation

## 2023-11-15 DIAGNOSIS — K621 Rectal polyp: Secondary | ICD-10-CM | POA: Diagnosis not present

## 2023-11-15 DIAGNOSIS — I10 Essential (primary) hypertension: Secondary | ICD-10-CM | POA: Insufficient documentation

## 2023-11-15 DIAGNOSIS — Z7984 Long term (current) use of oral hypoglycemic drugs: Secondary | ICD-10-CM | POA: Insufficient documentation

## 2023-11-15 DIAGNOSIS — Z6841 Body Mass Index (BMI) 40.0 and over, adult: Secondary | ICD-10-CM | POA: Insufficient documentation

## 2023-11-15 DIAGNOSIS — D123 Benign neoplasm of transverse colon: Secondary | ICD-10-CM | POA: Diagnosis not present

## 2023-11-15 DIAGNOSIS — D122 Benign neoplasm of ascending colon: Secondary | ICD-10-CM | POA: Insufficient documentation

## 2023-11-15 DIAGNOSIS — Z87891 Personal history of nicotine dependence: Secondary | ICD-10-CM | POA: Diagnosis not present

## 2023-11-15 DIAGNOSIS — K648 Other hemorrhoids: Secondary | ICD-10-CM | POA: Insufficient documentation

## 2023-11-15 DIAGNOSIS — G4730 Sleep apnea, unspecified: Secondary | ICD-10-CM | POA: Diagnosis not present

## 2023-11-15 DIAGNOSIS — K644 Residual hemorrhoidal skin tags: Secondary | ICD-10-CM | POA: Diagnosis not present

## 2023-11-15 DIAGNOSIS — K5730 Diverticulosis of large intestine without perforation or abscess without bleeding: Secondary | ICD-10-CM | POA: Insufficient documentation

## 2023-11-15 DIAGNOSIS — E6689 Other obesity not elsewhere classified: Secondary | ICD-10-CM | POA: Insufficient documentation

## 2023-11-15 HISTORY — PX: COLONOSCOPY: SHX5424

## 2023-11-15 LAB — GLUCOSE, CAPILLARY: Glucose-Capillary: 170 mg/dL — ABNORMAL HIGH (ref 70–99)

## 2023-11-15 SURGERY — COLONOSCOPY
Anesthesia: Monitor Anesthesia Care

## 2023-11-15 MED ORDER — SODIUM CHLORIDE 0.9 % IV SOLN
INTRAVENOUS | Status: AC | PRN
  Administered 2023-11-15: 500 mL via INTRAMUSCULAR

## 2023-11-15 MED ORDER — PROPOFOL 10 MG/ML IV BOLUS
INTRAVENOUS | Status: DC | PRN
  Administered 2023-11-15: 20 mg via INTRAVENOUS
  Administered 2023-11-15: 10 mg via INTRAVENOUS
  Administered 2023-11-15: 30 mg via INTRAVENOUS
  Administered 2023-11-15 (×4): 20 mg via INTRAVENOUS

## 2023-11-15 MED ORDER — PROPOFOL 500 MG/50ML IV EMUL
INTRAVENOUS | Status: AC
  Filled 2023-11-15: qty 50

## 2023-11-15 MED ORDER — PROPOFOL 500 MG/50ML IV EMUL
INTRAVENOUS | Status: DC | PRN
  Administered 2023-11-15: 150 ug/kg/min via INTRAVENOUS

## 2023-11-15 MED ORDER — PROPOFOL 1000 MG/100ML IV EMUL
INTRAVENOUS | Status: AC
  Filled 2023-11-15: qty 100

## 2023-11-15 MED ORDER — PROPOFOL 10 MG/ML IV BOLUS: INTRAVENOUS | Status: DC | PRN

## 2023-11-15 NOTE — Anesthesia Procedure Notes (Signed)
 Procedure Name: MAC Date/Time: 11/15/2023 8:16 AM  Performed by: Ulanda Gambles, CRNAPre-anesthesia Checklist: Patient identified, Emergency Drugs available, Suction available and Patient being monitored Oxygen Delivery Method: Simple face mask

## 2023-11-15 NOTE — Transfer of Care (Signed)
 Immediate Anesthesia Transfer of Care Note  Patient: Scott Franklin  Procedure(s) Performed: COLONOSCOPY  Patient Location: PACU  Anesthesia Type:MAC  Level of Consciousness: awake, alert , and oriented  Airway & Oxygen Therapy: Patient Spontanous Breathing and Patient connected to face mask oxygen  Post-op Assessment: Report given to RN, Post -op Vital signs reviewed and stable, and Patient moving all extremities X 4  Post vital signs: Reviewed and stable  Last Vitals:  Vitals Value Taken Time  BP 144/60 11/15/23 0900  Temp    Pulse 76 11/15/23 0901  Resp 14 11/15/23 0901  SpO2 100 % 11/15/23 0901  Vitals shown include unfiled device data.  Last Pain:  Vitals:   11/15/23 0721  TempSrc: Temporal  PainSc: 0-No pain         Complications: No notable events documented.

## 2023-11-15 NOTE — Discharge Instructions (Signed)
 YOU HAD AN ENDOSCOPIC PROCEDURE TODAY: Refer to the procedure report and other information in the discharge instructions given to you for any specific questions about what was found during the examination. If this information does not answer your questions, please call the [facility] office at [phone] to clarify.   YOU SHOULD EXPECT: Some feelings of bloating in the abdomen. Passage of more gas than usual. Walking can help get rid of the air that was put into your GI tract during the procedure and reduce the bloating. If you had a lower endoscopy (such as a colonoscopy or flexible sigmoidoscopy) you may notice spotting of blood in your stool or on the toilet paper. Some abdominal soreness may be present for a day or two, also.  DIET: Your first meal following the procedure should be a light meal and then it is ok to progress to your normal diet. A half-sandwich or bowl of soup is an example of a good first meal. Heavy or fried foods are harder to digest and may make you feel nauseous or bloated. Drink plenty of fluids but you should avoid alcoholic beverages for 24 hours.    ACTIVITY: Your care partner should take you home directly after the procedure. You should plan to take it easy, moving slowly for the rest of the day. You can resume normal activity the day after the procedure however YOU SHOULD NOT DRIVE, use power tools, machinery or perform tasks that involve climbing or major physical exertion for 24 hours (because of the sedation medicines used during the test).   SYMPTOMS TO REPORT IMMEDIATELY: A gastroenterologist can be reached at any hour. Please call [phone]  for any of the following symptoms:  Following lower endoscopy (colonoscopy, flexible sigmoidoscopy) Excessive amounts of blood in the stool  Significant tenderness, worsening of abdominal pains  Swelling of the abdomen that is new, acute  Fever of 100 or higher    FOLLOW UP:  If any biopsies were taken you will be  contacted by phone or by letter within the next 1-3 weeks. Call [phone]  if you have not heard about the biopsies in 3 weeks.  Please also call with any specific questions about appointments or follow up tests.

## 2023-11-15 NOTE — Op Note (Addendum)
 [facility] Patient Name: Scott Franklin Procedure Date: 11/15/2023 MRN: [id number] Attending MD: Felecia Hopper , MD, [id number] Date of Birth: 1976-02-18 CSN: [id number] Age: 48 Admit Type: Outpatient Procedure:                Colonoscopy Indications:              Screening for colorectal malignant neoplasm,                            Screening for colon cancer: Family history of                            colorectal cancer in distant relative(s), Colon                            cancer screening in patient at increased risk:                            Family history of 1st-degree relative with colon                            polyps Providers:                Felecia Hopper, MD, Suzann Ernst, RN, Tyrus Gallus, Technician Referring MD:              Medicines:                 Complications:            No immediate complications. Estimated Blood Loss:     Estimated blood loss was minimal. Procedure:                Pre-Anesthesia Assessment:                           - Prior to the procedure, a History and Physical                            was performed, and patient medications and                            allergies were reviewed. The patient's tolerance of                            previous anesthesia was also reviewed. The risks                            and benefits of the procedure and the sedation                            options and risks were discussed with the patient.                            All questions were answered, and informed consent  was obtained. Prior Anticoagulants: The patient has                            taken no anticoagulant or antiplatelet agents. ASA                            Grade Assessment: IV - A patient with severe                            systemic disease that is a constant threat to life.                            After reviewing the risks and benefits, the  patient                            was deemed in satisfactory condition to undergo the                            procedure.                           After obtaining informed consent, the colonoscope                            was passed under direct vision. Throughout the                            procedure, the patient's blood pressure, pulse, and                            oxygen saturations were monitored continuously. The                            PCF-HQ190L ([id number]) Olympus colonoscope was                            introduced through the anus and advanced to the the                            ileocecal valve. The colonoscopy was technically                            difficult and complex due to the patient's body                            habitus. Successful completion of the procedure was                            aided by changing the patient to a supine position                            and applying abdominal pressure. The patient  tolerated the procedure well. The quality of the                            bowel preparation was good. The terminal ileum and                            the rectum were photographed. Scope In: 8:24:05 AM Scope Out: 8:56:49 AM Total Procedure Duration: 0 hours 32 minutes 44 seconds  Findings:      Skin tags were found on perianal exam. The colonoscopy was technically       difficult and complex due to the patient's body habitus.Cecum was not       well visualized.      The colon (entire examined portion) was significantly tortuous.       Advancing the scope required changing the patient to a supine position       and applying abdominal pressure.      Three sessile polyps were found in the transverse colon and ascending       colon. The polyps were 4 to 6 mm in size. These polyps were removed with       a cold snare. Resection and retrieval were complete.      Multiple small-mouthed diverticula were found in the  sigmoid colon.      A 3 mm polyp was found in the rectum. The polyp was sessile. The polyp       was removed with a cold snare. Resection and retrieval were complete.      Internal hemorrhoids were found during retroflexion. The hemorrhoids       were small. Impression:               - Perianal skin tags found on perianal exam.                           - Tortuous colon. The colonoscopy was technically                            difficult and complex due to the patient's body                            habitus.Cecum was not well visualized.                           - Three 4 to 6 mm polyps in the transverse colon                            and in the ascending colon, removed with a cold                            snare. Resected and retrieved.                           - Diverticulosis in the sigmoid colon.                           - One 3 mm polyp in the rectum, removed with a cold  snare. Resected and retrieved.                           - Internal hemorrhoids. Moderate Sedation:      Moderate (conscious) sedation was personally administered by an       anesthesia professional. The following parameters were monitored: oxygen       saturation, heart rate, blood pressure, and response to care. Recommendation:           - Patient has a contact number available for                            emergencies. The signs and symptoms of potential                            delayed complications were discussed with the                            patient. Return to normal activities tomorrow.                            Written discharge instructions were provided to the                            patient.                           - Resume previous diet.                           - Continue present medications.                           - Await pathology results.                           - Perform a virtual colonoscopy at appointment to                            be  scheduled. Procedure Code(s):        --- Professional ---                           [phone], Colonoscopy, flexible; with removal of                            tumor(s), polyp(s), or other lesion(s) by snare                            technique Diagnosis Code(s):        --- Professional ---                           Z12.11, Encounter for screening for malignant                            neoplasm of colon  Z80.0, Family history of malignant neoplasm of                            digestive organs                           Z83.71, Family history of colonic polyps                           D12.3, Benign neoplasm of transverse colon (hepatic                            flexure or splenic flexure)                           D12.2, Benign neoplasm of ascending colon                           K64.8, Other hemorrhoids                           K64.4, Residual hemorrhoidal skin tags                           K57.30, Diverticulosis of large intestine without                            perforation or abscess without bleeding                           Q43.8, Other specified congenital malformations of                            intestine CPT copyright 2022 [employer]. All rights reserved. The codes documented in this report are preliminary and upon coder review may  be revised to meet current compliance requirements. Felecia Hopper, MD Felecia Hopper, MD 11/15/2023 9:17:51 AM Number of Addenda: 0

## 2023-11-15 NOTE — H&P (Signed)
 Primary Care Physician:  Karalee Oscar, PA Primary Gastroenterologist:  Dr. Veronda Goody  Reason for Visit -screening colonoscopy  HPI: Scott Franklin is a 48 y.o. male with past medical history of morbid obesity with BMI of 66, history of hypertension and diabetes here for outpatient screening colonoscopy.  No previous colonoscopy.  Distant family members with colon cancer and colon polyps.  Patient is complaining of intermittent loose stools.  Denies abdominal pain, nausea or vomiting.  Denies blood in the stool or black stool.  Past Medical History:  Diagnosis Date   Anemia    Cellulitis    Depression    Diabetes mellitus without complication (HCC)    ED (erectile dysfunction)    Hypertension    Obesity    Other testicular hypofunction    Panhypopituitarism (HCC)    Sebaceous cyst    Testosterone  deficiency     History reviewed. No pertinent surgical history.  Prior to Admission medications   Medication Sig Start Date End Date Taking? Authorizing Provider  atorvastatin  (LIPITOR ) 80 MG tablet Take 80 mg by mouth daily.   Yes [provider]  cholecalciferol (VITAMIN D3) 25 MCG (1000 UNIT) tablet Take 1,000 Units by mouth daily.   Yes [provider]  clomiPHENE  (CLOMID ) 50 MG tablet Take 1 tablet (50 mg total) by mouth daily. 04/26/23  Yes Shamleffer, Ibtehal Jaralla, MD  glimepiride  (AMARYL ) 4 MG tablet Take 1 tablet (4 mg total) by mouth daily before breakfast. 10/12/22  Yes Shamleffer, Ibtehal Jaralla, MD  hydrochlorothiazide  (HYDRODIURIL ) 12.5 MG tablet TAKE 1 TABLET (12.5 MG TOTAL) BY MOUTH DAILY. 08/29/20 11/15/23 Yes Hall, Carole N, DO  lisinopril  (ZESTRIL ) 10 MG tablet TAKE 1 TABLET (10 MG TOTAL) BY MOUTH DAILY. 08/29/20 11/15/23 Yes Bary Boss, DO  metFORMIN  (GLUCOPHAGE ) 500 MG tablet Take 2 tablets (1,000 mg total) by mouth 2 (two) times daily with a meal. 10/12/22  Yes Shamleffer, Julian Obey, MD  triamcinolone  (KENALOG ) 0.1 % Apply 1  application topically 2 (two) times daily as needed (flares). 08/01/20  Yes [provider]  fluorometholone  (FML) 0.1 % ophthalmic suspension Place 1 drop into the left eye in the morning.  10/21/19   [provider]  Lancets [facility] DELICA PLUS LANCET33G) MISC USE AS DIRECTED FOUR TIMES DAILY 03/23/23   Shamleffer, Julian Obey, MD  [facility] VERIO test strip USE AS DIRECTED FOUR TIMES DAILY 01/22/22   Shamleffer, Julian Obey, MD    Scheduled Meds: Continuous Infusions:  sodium chloride  500 mL (11/15/23 0736)   PRN Meds:.sodium chloride   Allergies as of 09/26/2023 - Review Complete 04/01/2023  Allergen Reaction Noted   Amoxicillin Anaphylaxis, Swelling, and Other (See Comments)    Other Itching, Rash, and Other (See Comments) 06/08/2017   Soap Rash and Other (See Comments) 10/06/2016   Corn oil Other (See Comments) 10/08/2019   Corn starch Other (See Comments) 10/08/2019    Family History  Problem Relation Age of Onset   Hypertension Father    Heart attack Father    Hypertrophic cardiomyopathy Father    Healthy Mother    Diabetes Neg Hx     Social History   Socioeconomic History   Marital status: Single    Spouse name: Not on file   Number of children: Not on file   Years of education: Not on file   Highest education level: Not on file  Occupational History   Occupation: student    Employer: [employer]  Tobacco Use   Smoking status: Former  Current packs/day: 0.00    Average packs/day: 0.5 packs/day for 3.0 years (1.5 ttl pk-yrs)    Types: Cigarettes    Start date: 06/21/1994    Quit date: 06/21/1997    Years since quitting: 26.4   Smokeless tobacco: Never  Substance and Sexual Activity   Alcohol use: No   Drug use: No   Sexual activity: Not Currently  Other Topics Concern   Not on file  Social History Narrative   Not on file   Social Drivers of Health   Financial Resource Strain: Not on file  Food Insecurity: Not on file   Transportation Needs: Not on file  Physical Activity: Not on file  Stress: Not on file  Social Connections: Not on file  Intimate Partner Violence: Not on file    Review of Systems: All negative except as stated above in HPI.  Physical Exam: Vital signs: Vitals:   11/15/23 0721  BP: (!) 143/76  Pulse: 76  Resp: 20  Temp: (!) 97.5 F (36.4 C)  SpO2: 95%     General:   Morbidly obese, not in acute distress Lungs: No visible respiratory distress Heart:  Regular rate and rhythm; no murmurs, clicks, rubs,  or gallops. Abdomen: Protuberant abdomen with difficult examination but otherwise abdominal exam is benign.  No peritoneal signs.  Bowel sound present. Mood and affect normal Alert and oriented x 3 Rectal:  Deferred  GI:  Lab Results: No results for input(s): "WBC", "HGB", "HCT", "PLT" in the last 72 hours. BMET No results for input(s): "NA", "K", "CL", "CO2", "GLUCOSE", "BUN", "CREATININE", "CALCIUM " in the last 72 hours. LFT No results for input(s): "PROT", "ALBUMIN", "AST", "ALT", "ALKPHOS", "BILITOT", "BILIDIR", "IBILI" in the last 72 hours. PT/INR No results for input(s): "LABPROT", "INR" in the last 72 hours.   Studies/Results: No results found.  Impression/Plan: -Screening colonoscopy - Loose stools - Morbid obesity  Recommendations --------------------------- - Proceed with colonoscopy today.  Risks (bleeding, infection, bowel perforation that could require surgery, sedation-related changes in cardiopulmonary systems), benefits (identification and possible treatment of source of symptoms, exclusion of certain causes of symptoms), and alternatives (watchful waiting, radiographic imaging studies, empiric medical treatment)  were explained to patient/family in detail and patient wishes to proceed.     LOS: 0 days   Felecia Hopper  MD, FACP 11/15/2023, 7:37 AM  Contact #  [phone]

## 2023-11-15 NOTE — Anesthesia Postprocedure Evaluation (Signed)
 Anesthesia Post Note  Patient: Scott Franklin  Procedure(s) Performed: COLONOSCOPY     Patient location during evaluation: PACU Anesthesia Type: MAC Level of consciousness: awake and alert Pain management: pain level controlled Vital Signs Assessment: post-procedure vital signs reviewed and stable Respiratory status: spontaneous breathing, nonlabored ventilation and respiratory function stable Cardiovascular status: stable and blood pressure returned to baseline Anesthetic complications: no  No notable events documented.  Last Vitals:  Vitals:   11/15/23 0910 11/15/23 0920  BP: (!) 151/72 (!) 146/63  Pulse: 78 69  Resp: 16 10  Temp:    SpO2: 99% 98%    Last Pain:  Vitals:   11/15/23 0920  TempSrc:   PainSc: 0-No pain                 Juventino Oppenheim

## 2023-11-16 LAB — SURGICAL PATHOLOGY

## 2023-11-17 ENCOUNTER — Encounter (HOSPITAL_COMMUNITY): Payer: Self-pay | Admitting: Gastroenterology

## 2024-01-18 DIAGNOSIS — I10 Essential (primary) hypertension: Secondary | ICD-10-CM | POA: Diagnosis not present

## 2024-01-18 DIAGNOSIS — E1165 Type 2 diabetes mellitus with hyperglycemia: Secondary | ICD-10-CM | POA: Diagnosis not present

## 2024-01-18 DIAGNOSIS — Z6841 Body Mass Index (BMI) 40.0 and over, adult: Secondary | ICD-10-CM | POA: Diagnosis not present

## 2024-01-20 DIAGNOSIS — E1165 Type 2 diabetes mellitus with hyperglycemia: Secondary | ICD-10-CM | POA: Diagnosis not present

## 2024-01-20 DIAGNOSIS — I10 Essential (primary) hypertension: Secondary | ICD-10-CM | POA: Diagnosis not present

## 2024-01-20 DIAGNOSIS — E119 Type 2 diabetes mellitus without complications: Secondary | ICD-10-CM | POA: Diagnosis not present

## 2024-01-20 DIAGNOSIS — E538 Deficiency of other specified B group vitamins: Secondary | ICD-10-CM | POA: Diagnosis not present

## 2024-01-20 DIAGNOSIS — Z125 Encounter for screening for malignant neoplasm of prostate: Secondary | ICD-10-CM | POA: Diagnosis not present

## 2024-01-20 DIAGNOSIS — E559 Vitamin D deficiency, unspecified: Secondary | ICD-10-CM | POA: Diagnosis not present

## 2024-01-20 DIAGNOSIS — E291 Testicular hypofunction: Secondary | ICD-10-CM | POA: Diagnosis not present

## 2024-04-27 DIAGNOSIS — E7800 Pure hypercholesterolemia, unspecified: Secondary | ICD-10-CM | POA: Diagnosis not present

## 2024-04-27 DIAGNOSIS — E1165 Type 2 diabetes mellitus with hyperglycemia: Secondary | ICD-10-CM | POA: Diagnosis not present

## 2024-04-27 DIAGNOSIS — I10 Essential (primary) hypertension: Secondary | ICD-10-CM | POA: Diagnosis not present

## 2024-04-27 DIAGNOSIS — E291 Testicular hypofunction: Secondary | ICD-10-CM | POA: Diagnosis not present

## 2024-04-27 DIAGNOSIS — E559 Vitamin D deficiency, unspecified: Secondary | ICD-10-CM | POA: Diagnosis not present
